# Patient Record
Sex: Male | Born: 1955 | Race: White | Hispanic: No | Marital: Single
Health system: Midwestern US, Academic
[De-identification: ages and names within clinical notes are randomized; demographics above are authoritative.]

---

## 2018-06-09 ENCOUNTER — Inpatient Hospital Stay: Admit: 2018-06-09 | Discharge: 2018-06-18 | Disposition: A | Discharge status: Skilled Nursing Facility | Payer: 59

## 2018-06-09 ENCOUNTER — Encounter: Admit: 2018-06-09 | Discharge: 2018-06-09 | Payer: 59

## 2018-06-09 DIAGNOSIS — R079 Chest pain, unspecified: ICD-10-CM

## 2018-06-09 DIAGNOSIS — E785 Hyperlipidemia, unspecified: Principal | ICD-10-CM

## 2018-06-09 DIAGNOSIS — R7989 Other specified abnormal findings of blood chemistry: ICD-10-CM

## 2018-06-09 LAB — POC TROPONIN

## 2018-06-09 LAB — CBC AND DIFF
Lab: 0.1 10*3/uL (ref 0–0.20)
Lab: 0.4 10*3/uL (ref 0–0.45)
Lab: 8.2 K/UL — ABNORMAL LOW (ref 4.5–11.0)

## 2018-06-09 MED ORDER — POTASSIUM CHLORIDE 20 MEQ PO TBTQ
40 meq | Freq: Once | ORAL | 0 refills | Status: CP
Start: 2018-06-09 — End: ?
  Administered 2018-06-10: 04:00:00 40 meq via ORAL

## 2018-06-09 MED ORDER — ENOXAPARIN 40 MG/0.4 ML SC SYRG
40 mg | Freq: Every day | SUBCUTANEOUS | 0 refills | Status: DC
Start: 2018-06-09 — End: 2018-06-19
  Administered 2018-06-10 – 2018-06-18 (×7): 40 mg via SUBCUTANEOUS

## 2018-06-09 MED ORDER — CLONAZEPAM 1 MG PO TAB
1 mg | Freq: Two times a day (BID) | ORAL | 0 refills | Status: DC
Start: 2018-06-09 — End: 2018-06-10
  Administered 2018-06-10: 14:00:00 1 mg via ORAL

## 2018-06-09 MED ORDER — CLOPIDOGREL 75 MG PO TAB
75 mg | Freq: Every day | ORAL | 0 refills | Status: DC
Start: 2018-06-09 — End: 2018-06-13
  Administered 2018-06-10 – 2018-06-13 (×3): 75 mg via ORAL

## 2018-06-09 MED ORDER — QUETIAPINE 200 MG PO TAB
200 mg | Freq: Every evening | ORAL | 0 refills | Status: DC
Start: 2018-06-09 — End: 2018-06-19
  Administered 2018-06-10 – 2018-06-18 (×9): 200 mg via ORAL

## 2018-06-09 MED ORDER — FLUOXETINE 10 MG PO CAP
20 mg | Freq: Every day | ORAL | 0 refills | Status: DC
Start: 2018-06-09 — End: 2018-06-19
  Administered 2018-06-10 – 2018-06-18 (×9): 20 mg via ORAL

## 2018-06-09 MED ORDER — ATORVASTATIN 40 MG PO TAB
40 mg | Freq: Every day | ORAL | 0 refills | Status: DC
Start: 2018-06-09 — End: 2018-06-19
  Administered 2018-06-10 – 2018-06-18 (×7): 40 mg via ORAL

## 2018-06-09 MED ORDER — BENZTROPINE 1 MG PO TAB
1 mg | Freq: Every evening | ORAL | 0 refills | Status: DC
Start: 2018-06-09 — End: 2018-06-19
  Administered 2018-06-10 – 2018-06-18 (×9): 1 mg via ORAL

## 2018-06-10 LAB — COMPREHENSIVE METABOLIC PANEL
Lab: 0.4 mg/dL (ref 0.3–1.2)
Lab: 1.1 mg/dL (ref >60)
Lab: 103 mg/dL — ABNORMAL HIGH (ref 70–100)
Lab: 13 K/UL — ABNORMAL HIGH (ref 3–12)
Lab: 139 MMOL/L — ABNORMAL HIGH (ref 137–147)
Lab: 163 U/L — ABNORMAL HIGH (ref 25–110)
Lab: 21 mg/dL — ABNORMAL HIGH (ref >60)
Lab: 24 MMOL/L (ref 21–30)
Lab: 26 U/L (ref 7–56)
Lab: 3.8 MMOL/L — ABNORMAL HIGH (ref 3.5–5.1)
Lab: 8.2 g/dL — ABNORMAL HIGH (ref 6.0–8.0)
Lab: 9.8 mg/dL — ABNORMAL LOW (ref 8.5–10.6)
Lab: >60 mL/min (ref >60)
Lab: >60 mL/min (ref >60)
Lab: 103 MMOL/L — ABNORMAL HIGH (ref 98–110)
Lab: 138 MMOL/L — ABNORMAL LOW (ref >60)
Lab: 4.6 MMOL/L (ref 3.5–5.1)

## 2018-06-10 LAB — BNP (B-TYPE NATRIURETIC PEPTI): Lab: 57 pg/mL — ABNORMAL LOW (ref 0–100)

## 2018-06-10 LAB — OPIATES-URINE RANDOM: Lab: NEGATIVE

## 2018-06-10 LAB — CBC
Lab: 4.9 M/UL — ABNORMAL HIGH (ref 4.4–5.5)
Lab: 8.5 K/UL — ABNORMAL LOW (ref 4.5–11.0)

## 2018-06-10 LAB — TROPONIN-I

## 2018-06-10 LAB — PHENCYCLIDINES-URINE RANDOM: Lab: NEGATIVE

## 2018-06-10 LAB — CANNABINOIDS-URINE RANDOM: Lab: NEGATIVE

## 2018-06-10 LAB — POC TROPONIN

## 2018-06-10 LAB — BARBITURATES-URINE RANDOM: Lab: NEGATIVE

## 2018-06-10 LAB — MAGNESIUM: Lab: 2.2 mg/dL — ABNORMAL HIGH (ref >60)

## 2018-06-10 LAB — COCAINE-URINE RANDOM: Lab: NEGATIVE

## 2018-06-10 LAB — AMPHETAMINES-URINE RANDOM: Lab: NEGATIVE

## 2018-06-10 LAB — HEMOGLOBIN A1C: Lab: 5.8 % (ref 4.0–6.0)

## 2018-06-10 LAB — BENZODIAZEPINES-URINE RANDOM: Lab: POSITIVE — AB

## 2018-06-10 MED ORDER — CLONAZEPAM 0.5 MG PO TAB
.5 mg | Freq: Two times a day (BID) | ORAL | 0 refills | Status: DC
Start: 2018-06-10 — End: 2018-06-13
  Administered 2018-06-11 – 2018-06-13 (×6): 0.5 mg via ORAL

## 2018-06-10 MED ORDER — ASPIRIN 81 MG PO CHEW
81 mg | Freq: Every day | ORAL | 0 refills | Status: DC
Start: 2018-06-10 — End: 2018-06-11
  Administered 2018-06-10 – 2018-06-11 (×2): 81 mg via ORAL

## 2018-06-10 MED ORDER — ONDANSETRON HCL (PF) 4 MG/2 ML IJ SOLN
4 mg | INTRAVENOUS | 0 refills | Status: DC | PRN
Start: 2018-06-10 — End: 2018-06-13
  Administered 2018-06-10 – 2018-06-11 (×3): 4 mg via INTRAVENOUS

## 2018-06-10 MED ORDER — ACETAMINOPHEN 325 MG PO TAB
650 mg | ORAL | 0 refills | Status: DC | PRN
Start: 2018-06-10 — End: 2018-06-13
  Administered 2018-06-11 – 2018-06-13 (×3): 650 mg via ORAL

## 2018-06-10 MED ORDER — QUETIAPINE 25 MG PO TAB
50 mg | Freq: Two times a day (BID) | ORAL | 0 refills | Status: DC
Start: 2018-06-10 — End: 2018-06-19
  Administered 2018-06-10 – 2018-06-18 (×16): 50 mg via ORAL

## 2018-06-11 ENCOUNTER — Observation Stay: Admit: 2018-06-11 | Discharge: 2018-06-11 | Payer: 59

## 2018-06-11 LAB — COMPREHENSIVE METABOLIC PANEL
Lab: 103 MMOL/L — ABNORMAL HIGH (ref >60)
Lab: 136 MMOL/L — ABNORMAL LOW (ref 137–147)

## 2018-06-11 LAB — MAGNESIUM: Lab: 2.1 mg/dL — ABNORMAL HIGH (ref 1.6–2.6)

## 2018-06-11 MED ORDER — PERFLUTREN LIPID MICROSPHERES 1.1 MG/ML IV SUSP
1-20 mL | Freq: Once | INTRAVENOUS | 0 refills | Status: CP | PRN
Start: 2018-06-11 — End: ?
  Administered 2018-06-11: 14:00:00 2 mL via INTRAVENOUS

## 2018-06-11 MED ORDER — AMINOPHYLLINE 500 MG/20 ML IV SOLN
50 mg | INTRAVENOUS | 0 refills | Status: AC | PRN
Start: 2018-06-11 — End: ?

## 2018-06-11 MED ORDER — SODIUM CHLORIDE 0.9 % IV SOLP
INTRAVENOUS | 0 refills | Status: DC
Start: 2018-06-11 — End: 2018-06-12
  Administered 2018-06-11 (×2): 1000.000 mL via INTRAVENOUS

## 2018-06-11 MED ORDER — BENZOCAINE-MENTHOL 6-10 MG MM LOZG
1 | Freq: Once | ORAL | 0 refills | Status: AC | PRN
Start: 2018-06-11 — End: ?

## 2018-06-11 MED ORDER — ALBUTEROL SULFATE 90 MCG/ACTUATION IN HFAA
2 | RESPIRATORY_TRACT | 0 refills | Status: DC | PRN
Start: 2018-06-11 — End: 2018-06-19
  Administered 2018-06-11: 12:00:00 2 via RESPIRATORY_TRACT

## 2018-06-11 MED ORDER — ASPIRIN 81 MG PO CHEW
81 mg | Freq: Every day | ORAL | 0 refills | Status: DC
Start: 2018-06-11 — End: 2018-06-13
  Administered 2018-06-13: 14:00:00 81 mg via ORAL

## 2018-06-11 MED ORDER — ALBUTEROL SULFATE 90 MCG/ACTUATION IN HFAA
2 | RESPIRATORY_TRACT | 0 refills | Status: DC | PRN
Start: 2018-06-11 — End: 2018-06-11

## 2018-06-11 MED ORDER — ASPIRIN 325 MG PO TAB
325 mg | Freq: Once | ORAL | 0 refills | Status: CP
Start: 2018-06-11 — End: ?
  Administered 2018-06-12: 11:00:00 325 mg via ORAL

## 2018-06-11 MED ORDER — SODIUM CHLORIDE 0.9 % IV SOLP
250 mL | INTRAVENOUS | 0 refills | Status: DC | PRN
Start: 2018-06-11 — End: 2018-06-11

## 2018-06-11 MED ORDER — NITROGLYCERIN 0.4 MG SL SUBL
.4 mg | SUBLINGUAL | 0 refills | Status: AC | PRN
Start: 2018-06-11 — End: ?

## 2018-06-11 MED ORDER — REGADENOSON 0.4 MG/5 ML IV SYRG
.4 mg | Freq: Once | INTRAVENOUS | 0 refills | Status: CP
Start: 2018-06-11 — End: ?
  Administered 2018-06-11: 14:00:00 0.4 mg via INTRAVENOUS

## 2018-06-12 LAB — COMPREHENSIVE METABOLIC PANEL: Lab: 138 MMOL/L — ABNORMAL HIGH (ref 137–147)

## 2018-06-12 LAB — MAGNESIUM: Lab: 2.1 mg/dL — ABNORMAL LOW (ref 1.6–2.6)

## 2018-06-12 LAB — CBC: Lab: 7.4 K/UL — ABNORMAL HIGH (ref 4.5–11.0)

## 2018-06-12 MED ORDER — POLYETHYLENE GLYCOL 3350 17 GRAM PO PWPK
1 | Freq: Every day | ORAL | 0 refills | Status: DC
Start: 2018-06-12 — End: 2018-06-16
  Administered 2018-06-12 – 2018-06-16 (×4): 17 g via ORAL

## 2018-06-13 ENCOUNTER — Encounter: Admit: 2018-06-13 | Discharge: 2018-06-13 | Payer: 59

## 2018-06-13 LAB — MAGNESIUM: Lab: 2.2 mg/dL — ABNORMAL HIGH (ref 1.6–2.6)

## 2018-06-13 LAB — CBC: Lab: 8.2 K/UL (ref >60)

## 2018-06-13 LAB — COMPREHENSIVE METABOLIC PANEL: Lab: 139 MMOL/L — ABNORMAL LOW (ref >60)

## 2018-06-13 MED ORDER — SODIUM CHLORIDE 0.9 % IV SOLP
INTRAVENOUS | 0 refills | Status: DC
Start: 2018-06-13 — End: 2018-06-13
  Administered 2018-06-13: 16:00:00 1000.000 mL via INTRAVENOUS

## 2018-06-13 MED ORDER — DIPHENHYDRAMINE HCL 50 MG/ML IJ SOLN
25 mg | INTRAVENOUS | 0 refills | Status: DC | PRN
Start: 2018-06-13 — End: 2018-06-15

## 2018-06-13 MED ORDER — SODIUM CHLORIDE 0.9 % IV SOLP
INTRAVENOUS | 0 refills | Status: AC
Start: 2018-06-13 — End: ?
  Administered 2018-06-13: 22:00:00 1000.000 mL via INTRAVENOUS

## 2018-06-13 MED ORDER — ASPIRIN 81 MG PO CHEW
81 mg | Freq: Every day | ORAL | 0 refills | Status: DC
Start: 2018-06-13 — End: 2018-06-19
  Administered 2018-06-14 – 2018-06-18 (×5): 81 mg via ORAL

## 2018-06-13 MED ORDER — DIPHENHYDRAMINE HCL 25 MG PO CAP
25 mg | ORAL | 0 refills | Status: DC | PRN
Start: 2018-06-13 — End: 2018-06-15

## 2018-06-13 MED ORDER — ONDANSETRON HCL (PF) 4 MG/2 ML IJ SOLN
4 mg | INTRAVENOUS | 0 refills | Status: DC | PRN
Start: 2018-06-13 — End: 2018-06-19
  Administered 2018-06-14 – 2018-06-16 (×3): 4 mg via INTRAVENOUS

## 2018-06-13 MED ORDER — CLOPIDOGREL 75 MG PO TAB
75 mg | Freq: Every day | ORAL | 0 refills | Status: DC
Start: 2018-06-13 — End: 2018-06-19
  Administered 2018-06-14 – 2018-06-18 (×5): 75 mg via ORAL

## 2018-06-13 MED ORDER — ASPIRIN 81 MG PO CHEW
243 mg | Freq: Once | ORAL | 0 refills | Status: CP
Start: 2018-06-13 — End: ?
  Administered 2018-06-13: 16:00:00 243 mg via ORAL

## 2018-06-13 MED ORDER — CLONAZEPAM 0.5 MG PO TAB
.5 mg | Freq: Three times a day (TID) | ORAL | 0 refills | Status: DC
Start: 2018-06-13 — End: 2018-06-19
  Administered 2018-06-14 – 2018-06-18 (×15): 0.5 mg via ORAL

## 2018-06-13 MED ORDER — CLOTRIMAZOLE 1 % TP CREA
Freq: Two times a day (BID) | TOPICAL | 0 refills | Status: DC
Start: 2018-06-13 — End: 2018-06-19
  Administered 2018-06-13 – 2018-06-16 (×4): via TOPICAL

## 2018-06-13 MED ORDER — ALUMINUM-MAGNESIUM HYDROXIDE 200-200 MG/5 ML PO SUSP
30 mL | ORAL | 0 refills | Status: DC | PRN
Start: 2018-06-13 — End: 2018-06-19

## 2018-06-13 MED ORDER — ACETAMINOPHEN 325 MG PO TAB
650 mg | ORAL | 0 refills | Status: DC | PRN
Start: 2018-06-13 — End: 2018-06-19
  Administered 2018-06-14 – 2018-06-17 (×5): 650 mg via ORAL

## 2018-06-14 LAB — COMPREHENSIVE METABOLIC PANEL
Lab: 137 MMOL/L — ABNORMAL LOW (ref 137–147)
Lab: 91 mg/dL — ABNORMAL HIGH (ref 70–100)

## 2018-06-14 LAB — MAGNESIUM: Lab: 2.4 mg/dL — ABNORMAL HIGH (ref >60)

## 2018-06-14 LAB — CBC: Lab: 7.4 K/UL — ABNORMAL LOW (ref >60)

## 2018-06-15 LAB — BASIC METABOLIC PANEL: Lab: 138 MMOL/L — ABNORMAL LOW (ref 137–147)

## 2018-06-15 LAB — URINALYSIS DIPSTICK REFLEX TO CULTURE
Lab: NEGATIVE % (ref 0–2)
Lab: NEGATIVE % (ref 0–5)
Lab: NEGATIVE 10*3/uL (ref 0–0.20)
Lab: NEGATIVE 10*3/uL — ABNORMAL HIGH (ref 0–0.80)
Lab: NEGATIVE 10*3/uL — ABNORMAL HIGH (ref 1.8–7.0)
Lab: NEGATIVE 10*3/uL — ABNORMAL LOW (ref 1.0–4.8)
Lab: NEGATIVE
Lab: NEGATIVE

## 2018-06-15 LAB — URINALYSIS MICROSCOPIC REFLEX TO CULTURE

## 2018-06-15 LAB — CBC
Lab: 4.6 M/UL — ABNORMAL LOW (ref 4.4–5.5)
Lab: 8 K/UL — ABNORMAL HIGH (ref 4.5–11.0)

## 2018-06-15 MED ORDER — FLUTICASONE PROPIONATE 50 MCG/ACTUATION NA SPSN
2 | Freq: Every day | NASAL | 0 refills | Status: DC
Start: 2018-06-15 — End: 2018-06-19
  Administered 2018-06-15: 18:00:00 2 via NASAL

## 2018-06-16 DIAGNOSIS — R079 Chest pain, unspecified: ICD-10-CM

## 2018-06-16 LAB — POC ACTIVATED CLOTTING TIME: Lab: 279 s

## 2018-06-16 LAB — BASIC METABOLIC PANEL: Lab: 139 MMOL/L — ABNORMAL LOW (ref 137–147)

## 2018-06-16 LAB — CBC: Lab: 9.1 K/UL — ABNORMAL HIGH (ref >60)

## 2018-06-16 MED ORDER — POLYETHYLENE GLYCOL 3350 17 GRAM PO PWPK
1 | Freq: Two times a day (BID) | ORAL | 0 refills | Status: DC
Start: 2018-06-16 — End: 2018-06-19
  Administered 2018-06-17 – 2018-06-18 (×4): 17 g via ORAL

## 2018-06-17 LAB — CBC: Lab: 8.7 K/UL — ABNORMAL HIGH (ref >60)

## 2018-06-17 LAB — BASIC METABOLIC PANEL: Lab: 140 MMOL/L — ABNORMAL HIGH (ref >60)

## 2018-06-17 MED ORDER — BISACODYL 10 MG RE SUPP
10 mg | Freq: Once | RECTAL | 0 refills | Status: AC
Start: 2018-06-17 — End: ?

## 2018-06-17 MED ORDER — MAGNESIUM CITRATE PO SOLN
296 mL | Freq: Once | ORAL | 0 refills | Status: CP
Start: 2018-06-17 — End: ?
  Administered 2018-06-17: 296 mL via ORAL

## 2018-06-18 ENCOUNTER — Observation Stay: Admit: 2018-06-11 | Discharge: 2018-06-11 | Payer: 59

## 2018-06-18 ENCOUNTER — Inpatient Hospital Stay: Admit: 2018-06-16 | Discharge: 2018-06-16 | Payer: 59

## 2018-06-18 ENCOUNTER — Emergency Department: Admit: 2018-06-09 | Discharge: 2018-06-09 | Payer: 59

## 2018-06-18 DIAGNOSIS — Z9119 Patient's noncompliance with other medical treatment and regimen: ICD-10-CM

## 2018-06-18 DIAGNOSIS — R21 Rash and other nonspecific skin eruption: ICD-10-CM

## 2018-06-18 DIAGNOSIS — E785 Hyperlipidemia, unspecified: ICD-10-CM

## 2018-06-18 DIAGNOSIS — F4322 Adjustment disorder with anxiety: ICD-10-CM

## 2018-06-18 DIAGNOSIS — I25110 Atherosclerotic heart disease of native coronary artery with unstable angina pectoris: Principal | ICD-10-CM

## 2018-06-18 DIAGNOSIS — F1490 Cocaine use, unspecified, uncomplicated: ICD-10-CM

## 2018-06-18 DIAGNOSIS — I249 Acute ischemic heart disease, unspecified: ICD-10-CM

## 2018-06-18 DIAGNOSIS — F411 Generalized anxiety disorder: ICD-10-CM

## 2018-06-18 DIAGNOSIS — F1590 Other stimulant use, unspecified, uncomplicated: ICD-10-CM

## 2018-06-18 DIAGNOSIS — F209 Schizophrenia, unspecified: ICD-10-CM

## 2018-06-18 DIAGNOSIS — F1010 Alcohol abuse, uncomplicated: ICD-10-CM

## 2018-06-18 DIAGNOSIS — K5900 Constipation, unspecified: ICD-10-CM

## 2018-06-18 DIAGNOSIS — N179 Acute kidney failure, unspecified: ICD-10-CM

## 2018-06-18 DIAGNOSIS — Z87891 Personal history of nicotine dependence: ICD-10-CM

## 2018-06-18 LAB — BASIC METABOLIC PANEL
Lab: 140 MMOL/L — ABNORMAL HIGH (ref 137–147)
Lab: 4.2 MMOL/L — ABNORMAL LOW (ref 3.5–5.1)

## 2018-06-18 LAB — CBC: Lab: 9.5 K/UL — ABNORMAL HIGH (ref 4.5–11.0)

## 2018-06-18 MED ORDER — MAGNESIUM HYDROXIDE 400 MG/5 ML PO SUSP
30 mL | ORAL | 0 refills | Status: SS | PRN
Start: 2018-06-18 — End: 2018-10-02

## 2018-06-18 MED ORDER — CLONAZEPAM 0.5 MG PO TAB
.5 mg | ORAL_TABLET | Freq: Three times a day (TID) | ORAL | 0 refills | Status: SS
Start: 2018-06-18 — End: 2018-10-02

## 2018-06-18 MED ORDER — ATORVASTATIN 40 MG PO TAB
40 mg | ORAL_TABLET | Freq: Every day | ORAL | 3 refills | Status: SS
Start: 2018-06-18 — End: 2018-10-02

## 2018-06-18 MED ORDER — POLYETHYLENE GLYCOL 3350 17 GRAM PO PWPK
17 g | Freq: Two times a day (BID) | ORAL | 0 refills | Status: SS
Start: 2018-06-18 — End: 2018-10-02

## 2018-06-20 ENCOUNTER — Ambulatory Visit: Admit: 2018-06-20 | Discharge: 2018-06-20 | Payer: 59

## 2018-06-24 ENCOUNTER — Ambulatory Visit: Admit: 2018-06-24 | Discharge: 2018-06-24 | Payer: 59

## 2018-06-25 ENCOUNTER — Encounter: Admit: 2018-06-25 | Discharge: 2018-06-25 | Payer: 59

## 2018-06-26 ENCOUNTER — Ambulatory Visit: Admit: 2018-06-26 | Discharge: 2018-06-26 | Payer: 59

## 2018-07-01 ENCOUNTER — Ambulatory Visit: Admit: 2018-07-01 | Discharge: 2018-07-01 | Payer: 59

## 2018-07-09 ENCOUNTER — Ambulatory Visit: Admit: 2018-07-09 | Discharge: 2018-07-09 | Payer: 59

## 2018-07-10 ENCOUNTER — Ambulatory Visit: Admit: 2018-07-10 | Discharge: 2018-07-10 | Payer: 59

## 2018-07-16 ENCOUNTER — Encounter: Admit: 2018-07-16 | Discharge: 2018-07-16 | Payer: 59

## 2018-07-22 ENCOUNTER — Encounter: Admit: 2018-07-22 | Discharge: 2018-07-22 | Payer: 59

## 2018-07-29 ENCOUNTER — Encounter: Admit: 2018-07-29 | Discharge: 2018-07-29 | Payer: 59

## 2018-09-30 ENCOUNTER — Encounter: Admit: 2018-09-30 | Discharge: 2018-09-30 | Payer: 59

## 2018-09-30 ENCOUNTER — Inpatient Hospital Stay: Admit: 2018-10-01 | Discharge: 2018-10-15 | Disposition: A | Discharge status: Skilled Nursing Facility | Payer: 59

## 2018-09-30 ENCOUNTER — Emergency Department: Admit: 2018-09-30 | Discharge: 2018-09-30 | Payer: 59

## 2018-09-30 DIAGNOSIS — E785 Hyperlipidemia, unspecified: Principal | ICD-10-CM

## 2018-09-30 DIAGNOSIS — R079 Chest pain, unspecified: Secondary | ICD-10-CM

## 2018-09-30 DIAGNOSIS — Z0279 Encounter for issue of other medical certificate: ICD-10-CM

## 2018-09-30 DIAGNOSIS — F331 Major depressive disorder, recurrent, moderate: Secondary | ICD-10-CM

## 2018-09-30 MED ORDER — IOHEXOL 350 MG IODINE/ML IV SOLN
75 mL | Freq: Once | INTRAVENOUS | 0 refills | Status: CP
Start: 2018-09-30 — End: ?
  Administered 2018-10-01: 06:00:00 75 mL via INTRAVENOUS

## 2018-09-30 MED ORDER — SODIUM CHLORIDE 0.9 % IJ SOLN
50 mL | Freq: Once | INTRAVENOUS | 0 refills | Status: CP
Start: 2018-09-30 — End: ?
  Administered 2018-10-01: 06:00:00 50 mL via INTRAVENOUS

## 2018-10-01 ENCOUNTER — Encounter: Admit: 2018-10-01 | Discharge: 2018-10-01 | Payer: 59

## 2018-10-01 ENCOUNTER — Emergency Department: Admit: 2018-10-01 | Discharge: 2018-10-01 | Payer: 59

## 2018-10-01 DIAGNOSIS — R079 Chest pain, unspecified: Secondary | ICD-10-CM

## 2018-10-01 DIAGNOSIS — I2510 Atherosclerotic heart disease of native coronary artery without angina pectoris: ICD-10-CM

## 2018-10-01 DIAGNOSIS — E785 Hyperlipidemia, unspecified: Principal | ICD-10-CM

## 2018-10-01 LAB — D-DIMER: Lab: 875 ng{FEU}/mL — ABNORMAL HIGH (ref <500)

## 2018-10-01 LAB — URINALYSIS DIPSTICK
Lab: 1 mg/dL (ref 1.003–1.035)
Lab: NEGATIVE 10*3/uL — ABNORMAL HIGH (ref 0–0.45)
Lab: NEGATIVE U/L (ref 7–56)
Lab: NEGATIVE mL/min (ref 0–0.80)
Lab: NEGATIVE mL/min (ref 1.0–4.8)
Lab: NEGATIVE
Lab: POSITIVE — AB

## 2018-10-01 LAB — COCAINE-URINE RANDOM: Lab: NEGATIVE

## 2018-10-01 LAB — TROPONIN-I: Lab: 0 ng/mL (ref 0.0–0.05)

## 2018-10-01 LAB — BENZODIAZEPINES-URINE RANDOM: Lab: POSITIVE — AB

## 2018-10-01 LAB — LIPID PROFILE
Lab: 131 mg/dL — ABNORMAL HIGH (ref <100)
Lab: 135 mg/dL
Lab: 16 mg/dL
Lab: 188 mg/dL (ref <200)
Lab: 53 mg/dL (ref >40)
Lab: 82 mg/dL (ref <150)

## 2018-10-01 LAB — CBC AND DIFF: Lab: 9.2 10*3/uL (ref 4.5–11.0)

## 2018-10-01 LAB — BARBITURATES-URINE RANDOM: Lab: NEGATIVE

## 2018-10-01 LAB — AMMONIA: Lab: 34 umol/L (ref 9–35)

## 2018-10-01 LAB — HEMOGLOBIN A1C: Lab: 5.7 % (ref 4.0–6.0)

## 2018-10-01 LAB — PHENCYCLIDINES-URINE RANDOM: Lab: NEGATIVE

## 2018-10-01 LAB — AMPHETAMINES-URINE RANDOM: Lab: NEGATIVE

## 2018-10-01 LAB — URINALYSIS, MICROSCOPIC

## 2018-10-01 LAB — CANNABINOIDS-URINE RANDOM: Lab: NEGATIVE

## 2018-10-01 LAB — COMPREHENSIVE METABOLIC PANEL
Lab: 101 MMOL/L — ABNORMAL LOW (ref 98–110)
Lab: 11 10*3/uL (ref 3–12)
Lab: 140 MMOL/L — ABNORMAL LOW (ref 137–147)
Lab: 96 mg/dL (ref 70–100)

## 2018-10-01 LAB — OPIATES-URINE RANDOM: Lab: NEGATIVE

## 2018-10-01 LAB — MAGNESIUM
Lab: 2.2 mg/dL — ABNORMAL HIGH (ref 1.6–2.6)
Lab: 2.2 mg/dL (ref 1.6–2.6)

## 2018-10-01 LAB — LIPASE: Lab: 21 U/L — ABNORMAL LOW (ref 11–82)

## 2018-10-01 LAB — PHOSPHORUS: Lab: 3.9 mg/dL (ref 2.0–4.5)

## 2018-10-01 MED ORDER — LORAZEPAM 1 MG PO TAB
1 mg | ORAL | 0 refills | Status: CN
Start: 2018-10-01 — End: ?

## 2018-10-01 MED ORDER — ALBUTEROL SULFATE 90 MCG/ACTUATION IN HFAA
2 | RESPIRATORY_TRACT | 0 refills | Status: DC | PRN
Start: 2018-10-01 — End: 2018-10-15
  Administered 2018-10-01: 14:00:00 2 via RESPIRATORY_TRACT

## 2018-10-01 MED ORDER — LORAZEPAM 1 MG PO TAB
1 mg | Freq: Two times a day (BID) | ORAL | 0 refills | Status: CN
Start: 2018-10-01 — End: ?

## 2018-10-01 MED ORDER — QUETIAPINE 25 MG PO TAB
50 mg | Freq: Two times a day (BID) | ORAL | 0 refills | Status: DC
Start: 2018-10-01 — End: 2018-10-02
  Administered 2018-10-02 (×2): 50 mg via ORAL

## 2018-10-01 MED ORDER — ATORVASTATIN 40 MG PO TAB
40 mg | Freq: Every day | ORAL | 0 refills | Status: DC
Start: 2018-10-01 — End: 2018-10-01

## 2018-10-01 MED ORDER — ALBUTEROL SULFATE 2.5 MG/0.5 ML IN NEBU
2.5 mg | RESPIRATORY_TRACT | 0 refills | Status: DC | PRN
Start: 2018-10-01 — End: 2018-10-02

## 2018-10-01 MED ORDER — ENOXAPARIN 40 MG/0.4 ML SC SYRG
40 mg | Freq: Every day | SUBCUTANEOUS | 0 refills | Status: DC
Start: 2018-10-01 — End: 2018-10-15
  Administered 2018-10-02 – 2018-10-15 (×12): 40 mg via SUBCUTANEOUS

## 2018-10-01 MED ORDER — IPRATROPIUM BROMIDE 0.02 % IN SOLN
.5 mg | RESPIRATORY_TRACT | 0 refills | Status: DC | PRN
Start: 2018-10-01 — End: 2018-10-02

## 2018-10-01 MED ORDER — LORAZEPAM 1 MG PO TAB
2 mg | ORAL | 0 refills | Status: CN
Start: 2018-10-01 — End: ?

## 2018-10-01 MED ORDER — PERFLUTREN LIPID MICROSPHERES 1.1 MG/ML IV SUSP
1-20 mL | Freq: Once | INTRAVENOUS | 0 refills | Status: CP | PRN
Start: 2018-10-01 — End: ?
  Administered 2018-10-01: 20:00:00 2 mL via INTRAVENOUS

## 2018-10-01 MED ORDER — CLOPIDOGREL 75 MG PO TAB
75 mg | Freq: Every day | ORAL | 0 refills | Status: DC
Start: 2018-10-01 — End: 2018-10-01

## 2018-10-01 MED ORDER — POLYETHYLENE GLYCOL 3350 17 GRAM PO PWPK
1 | Freq: Every day | ORAL | 0 refills | Status: DC | PRN
Start: 2018-10-01 — End: 2018-10-15
  Administered 2018-10-02: 16:00:00 17 g via ORAL

## 2018-10-01 MED ORDER — THIAMINE MONONITRATE (VIT B1) 100 MG PO TAB
100 mg | Freq: Every day | ORAL | 0 refills | Status: DC
Start: 2018-10-01 — End: 2018-10-15
  Administered 2018-10-01 – 2018-10-15 (×15): 100 mg via ORAL

## 2018-10-01 MED ORDER — DIPHENHYDRAMINE(#)/LIDOCAINE/ANTACID 1:1:1 PO SUSP
10 mL | Freq: Three times a day (TID) | ORAL | 0 refills | Status: DC
Start: 2018-10-01 — End: 2018-10-15
  Administered 2018-10-01 – 2018-10-13 (×8): 10 mL via ORAL

## 2018-10-01 MED ORDER — FLUOXETINE 10 MG PO CAP
40 mg | Freq: Every day | ORAL | 0 refills | Status: DC
Start: 2018-10-01 — End: 2018-10-15
  Administered 2018-10-01 – 2018-10-15 (×15): 40 mg via ORAL

## 2018-10-01 MED ORDER — CLOPIDOGREL 75 MG PO TAB
75 mg | Freq: Every day | ORAL | 0 refills | Status: DC
Start: 2018-10-01 — End: 2018-10-15
  Administered 2018-10-01 – 2018-10-15 (×14): 75 mg via ORAL

## 2018-10-01 MED ORDER — MULTIVITAMIN PO TAB
1 | Freq: Every day | ORAL | 0 refills | Status: DC
Start: 2018-10-01 — End: 2018-10-15
  Administered 2018-10-01 – 2018-10-15 (×15): 1 via ORAL

## 2018-10-01 MED ORDER — IPRATROPIUM BROMIDE 0.02 % IN SOLN
.5 mg | RESPIRATORY_TRACT | 0 refills | Status: DC | PRN
Start: 2018-10-01 — End: 2018-10-03

## 2018-10-01 MED ORDER — ONDANSETRON HCL (PF) 4 MG/2 ML IJ SOLN
4 mg | INTRAVENOUS | 0 refills | Status: DC | PRN
Start: 2018-10-01 — End: 2018-10-15

## 2018-10-01 MED ORDER — HALOPERIDOL LACTATE 5 MG/ML IJ SOLN
2 mg | INTRAMUSCULAR | 0 refills | Status: DC | PRN
Start: 2018-10-01 — End: 2018-10-13
  Administered 2018-10-03 – 2018-10-13 (×3): 2 mg via INTRAMUSCULAR

## 2018-10-01 MED ORDER — BENZTROPINE 1 MG PO TAB
1 mg | Freq: Every evening | ORAL | 0 refills | Status: DC
Start: 2018-10-01 — End: 2018-10-08
  Administered 2018-10-02 – 2018-10-08 (×7): 1 mg via ORAL

## 2018-10-01 MED ORDER — ONDANSETRON HCL 4 MG PO TAB
4 mg | ORAL | 0 refills | Status: DC | PRN
Start: 2018-10-01 — End: 2018-10-15

## 2018-10-01 MED ORDER — LORAZEPAM 1 MG PO TAB
1-2 mg | ORAL | 0 refills | Status: CN | PRN
Start: 2018-10-01 — End: ?

## 2018-10-01 MED ORDER — ALBUTEROL SULFATE 2.5 MG/0.5 ML IN NEBU
2.5 mg | RESPIRATORY_TRACT | 0 refills | Status: DC | PRN
Start: 2018-10-01 — End: 2018-10-03

## 2018-10-01 MED ORDER — PREDNISONE 20 MG PO TAB
40 mg | Freq: Every day | ORAL | 0 refills | Status: AC
Start: 2018-10-01 — End: ?
  Administered 2018-10-02 – 2018-10-05 (×4): 40 mg via ORAL

## 2018-10-01 MED ORDER — ASPIRIN 81 MG PO CHEW
81 mg | Freq: Every day | ORAL | 0 refills | Status: DC
Start: 2018-10-01 — End: 2018-10-15
  Administered 2018-10-01 – 2018-10-15 (×14): 81 mg via ORAL

## 2018-10-01 MED ORDER — HYDROXYZINE HCL 50 MG PO TAB
25-50 mg | ORAL | 0 refills | Status: DC | PRN
Start: 2018-10-01 — End: 2018-10-06
  Administered 2018-10-01: 16:00:00 25 mg via ORAL
  Administered 2018-10-02 – 2018-10-06 (×7): 50 mg via ORAL

## 2018-10-01 MED ORDER — VITAMIN B COMPLEX PO TAB
1 | Freq: Every day | ORAL | 0 refills | Status: DC
Start: 2018-10-01 — End: 2018-10-10
  Administered 2018-10-01 – 2018-10-10 (×10): 1 via ORAL

## 2018-10-01 MED ORDER — ASPIRIN 81 MG PO CHEW
81 mg | Freq: Every day | ORAL | 0 refills | Status: DC
Start: 2018-10-01 — End: 2018-10-01

## 2018-10-01 MED ORDER — MELATONIN 3 MG PO TAB
3 mg | Freq: Every evening | ORAL | 0 refills | Status: DC | PRN
Start: 2018-10-01 — End: 2018-10-15
  Administered 2018-10-02 – 2018-10-15 (×6): 3 mg via ORAL

## 2018-10-01 MED ORDER — FOLIC ACID 1 MG PO TAB
1 mg | Freq: Every day | ORAL | 0 refills | Status: DC
Start: 2018-10-01 — End: 2018-10-15
  Administered 2018-10-01 – 2018-10-14 (×13): 1 mg via ORAL

## 2018-10-01 MED ORDER — ATORVASTATIN 40 MG PO TAB
40 mg | Freq: Every day | ORAL | 0 refills | Status: DC
Start: 2018-10-01 — End: 2018-10-15
  Administered 2018-10-01 – 2018-10-15 (×14): 40 mg via ORAL

## 2018-10-01 MED ORDER — ACETAMINOPHEN 325 MG PO TAB
650 mg | ORAL | 0 refills | Status: DC | PRN
Start: 2018-10-01 — End: 2018-10-15
  Administered 2018-10-01 – 2018-10-14 (×11): 650 mg via ORAL

## 2018-10-01 MED ORDER — QUETIAPINE 100 MG PO TAB
200 mg | Freq: Every evening | ORAL | 0 refills | Status: DC
Start: 2018-10-01 — End: 2018-10-15
  Administered 2018-10-02 – 2018-10-15 (×13): 200 mg via ORAL

## 2018-10-01 MED ORDER — CLOPIDOGREL 75 MG PO TAB
225 mg | Freq: Once | ORAL | 0 refills | Status: CP
Start: 2018-10-01 — End: ?
  Administered 2018-10-01: 17:00:00 225 mg via ORAL

## 2018-10-01 MED ORDER — HALOPERIDOL 1 MG PO TAB
2 mg | ORAL | 0 refills | Status: DC | PRN
Start: 2018-10-01 — End: 2018-10-15
  Administered 2018-10-01 – 2018-10-13 (×9): 2 mg via ORAL

## 2018-10-01 NOTE — Consults
thinking is cloudy.  Having a hard time thinking clearly.  Difficulty answering questions.  Feels like his thinking has slowed down significantly.  Hard time finding the right words.  Feels like people are talking to him when they aren't.  It is unclear if this is a misperception or hallucinations.  He is significantly bothered by the distortion of reality.  This has been going on for the last 2 weeks.  Had a similar episode 3-4 years ago, but he was also hallucinating during that time.      Reports binging on alcohol.  Most recently drank 3 gallons over 3-5 days.  His last drink was reported to be 2-3 weeks ago, but he is a poor historian about the details.    Doesn't feel that his current medications are helping with his depression or anxiety.  Reports that he continues to struggle with low mood, a bland feeling about everything, sleep is variable.  Denies thoughts, intention, or plan to harm himself or others.  Symptoms of anxiety including feeling that he can't hardly lay or sit still for any period of time.  Feels that he constantly needs to be moving.  Also feels that his anxiety is worsened by his restless leg syndrome.  Has been out of the clonazepam for the last couple of weeks per his report.  Didn't have the money to afford his medications.  He reports that he was getting prescription medications but also buying it off the streets.  Reports he was taking 1 mg of Clonazepam prior to running out.  He purchased them off the streets to fill in when he would run out too quickly, but denies ever taking more than 1 mg at night.  Unclear how accurate the information is.  He did admit to taking Valium yesterday from a recent hospitalization.    Access to firearms?  denies    Past Psychiatric History:  Onset: years ago  Outpatient Provider: Neoma Laming through [facility] for the last couple of years  Diagnoses: anxiety, depression, alcohol use.  Denies diagnoses of Bipolar or Schizophrenia SpO2 Pulse: 93 (02/05 0829) BP: (125-169)/(68-126)   Temp:  [36.7 ???C (98.1 ???F)]   Pulse:  [76-98]   Respirations:  [12 PER MINUTE-22 PER MINUTE]   SpO2:  [89 %-100 %]      Mental Status Evaluation:    ??? General/Constitutional: 63 y/o white male who appears his approximate stated age.  Disheveled appearance.  Laying in hospital bed  ??? Eye Contact: fair  ??? Behavior: calm and cooperative.  Pleasant to talk to  ??? Speech: normal rate, rhythm, volume, and tone  ??? Mood: not good  ??? Affect: Dysphoric  ??? Thought Process: mostly linear.  Did have some trouble staying on topic but was redirectable  ??? Thought Content: Denies SI/HI  ??? Perception: Reports AH/VH  ??? Associations: intact  ??? Insight/Judgment: fair/partial    ??? Orientation: grossly intact  ??? Recent and remote memory: adequate for conversation, but did have some recall issues  ??? Attention span and concentration: adequate  ??? Cognition: alert  ??? Language: fluent, English speaker  ??? Fund of knowledge and vocabulary: below average    Focused Physical Exam:  ??? Neuro: no gross deficits noted on conversation  ??? Musculoskeletal: moved upper extremities without issue.  Didn't move lower extremities as much due to laying in hospital bed.    Lab/Radiology/Other Diagnostic Tests:  Pertinent labs reviewed    Cristine Polio, DO

## 2018-10-01 NOTE — ED Notes
Breakfast order placed for patient. Pt AAOx3. Unable to remember birth year but confirms it after having it said to him. Pt in NAD. Bilateral chest rise and fall. Bed in lowest, locked position and call light in reach. Pt updated on plan of care and denies having any needs at this time besides wanting to speak with the doctors when they round regarding his anxiety medications.

## 2018-10-01 NOTE — H&P (View-Only)
Non-medical: Not on file   Tobacco Use   ??? Smoking status: Former Smoker     Last attempt to quit: 06/09/2014     Years since quitting: 4.3   ??? Smokeless tobacco: Former Estate agent and Sexual Activity   ??? Alcohol use: Yes     Comment: states he drinks often but is unsure how often   ??? Drug use: Not Currently   ??? Sexual activity: Not on file   Lifestyle   ??? Physical activity:     Days per week: Not on file     Minutes per session: Not on file   ??? Stress: Not on file   Relationships   ??? Social connections:     Talks on phone: Not on file     Gets together: Not on file     Attends religious service: Not on file     Active member of club or organization: Not on file     Attends meetings of clubs or organizations: Not on file     Relationship status: Not on file   ??? Intimate partner violence:     Fear of current or ex partner: Not on file     Emotionally abused: Not on file     Physically abused: Not on file     Forced sexual activity: Not on file   Other Topics Concern   ??? Not on file   Social History Narrative   ??? Not on file      Immunizations (includes history and patient reported):   There is no immunization history on file for this patient.        Allergies:  Patient has no known allergies.    Medications:  Current Facility-Administered Medications   Medication   ??? aspirin chewable tablet 81 mg   ??? atorvastatin (LIPITOR) tablet 40 mg   ??? clopiDOGrel (PLAVIX) tablet 75 mg     Current Outpatient Medications   Medication Sig   ??? albuterol (PROAIR HFA) 90 mcg/actuation inhaler Inhale 2 puffs by mouth into the lungs every 6 hours as needed for Wheezing or Shortness of Breath. Shake well before use.   ??? aspirin 81 mg chewable tablet Chew 81 mg by mouth daily. Take with food.   ??? atorvastatin (LIPITOR) 40 mg tablet Take one tablet by mouth daily.   ??? benztropine (COGENTIN) 1 mg tablet Take 1 mg by mouth at bedtime daily.   ??? clonazePAM (KLONOPIN) 0.5 mg tablet Take one tablet by mouth three times daily. Skin:   Skin color, texture, turgor normal.  No rashes or lesions  Lymph nodes:  Cervical, supraclavicular nodes normal  Neurologic:  CNII - XII intact, unable to report the name of the hospital, but knows he is in a hospital, oriented to month and year.     Lab/Radiology/Other Diagnostic Tests:  24-hour labs:    Results for orders placed or performed during the hospital encounter of 09/30/18 (from the past 24 hour(s))   CBC AND DIFF    Collection Time: 09/30/18  9:32 PM   Result Value Ref Range    White Blood Cells 9.2 4.5 - 11.0 K/UL    RBC 4.23 (L) 4.4 - 5.5 M/UL    Hemoglobin 12.3 (L) 13.5 - 16.5 GM/DL    Hematocrit 16.1 (L) 40 - 50 %    MCV 86.4 80 - 100 FL    MCH 29.2 26 - 34 PG    MCHC 33.8 32.0 - 36.0 G/DL  RDW 13.4 11 - 15 %    Platelet Count 423 (H) 150 - 400 K/UL    MPV 7.0 7 - 11 FL    Neutrophils 68 41 - 77 %    Lymphocytes 18 (L) 24 - 44 %    Monocytes 8 4 - 12 %    Eosinophils 5 0 - 5 %    Basophils 1 0 - 2 %    Absolute Neutrophil Count 6.20 1.8 - 7.0 K/UL    Absolute Lymph Count 1.70 1.0 - 4.8 K/UL    Absolute Monocyte Count 0.70 0 - 0.80 K/UL    Absolute Eosinophil Count 0.50 (H) 0 - 0.45 K/UL    Absolute Basophil Count 0.10 0 - 0.20 K/UL   COMPREHENSIVE METABOLIC PANEL    Collection Time: 09/30/18  9:32 PM   Result Value Ref Range    Sodium 140 137 - 147 MMOL/L    Potassium 4.2 3.5 - 5.1 MMOL/L    Chloride 101 98 - 110 MMOL/L    Glucose 96 70 - 100 MG/DL    Blood Urea Nitrogen 9 7 - 25 MG/DL    Creatinine 1.61 0.4 - 1.24 MG/DL    Calcium 9.2 8.5 - 09.6 MG/DL    Total Protein 7.4 6.0 - 8.0 G/DL    Total Bilirubin 0.2 (L) 0.3 - 1.2 MG/DL    Albumin 4.0 3.5 - 5.0 G/DL    Alk Phosphatase 045 25 - 110 U/L    AST (SGOT) 25 7 - 40 U/L    CO2 28 21 - 30 MMOL/L    ALT (SGPT) 31 7 - 56 U/L    Anion Gap 11 3 - 12    eGFR Non African American >60 >60 mL/min    eGFR African American >60 >60 mL/min   LIPASE    Collection Time: 09/30/18  9:32 PM   Result Value Ref Range    Lipase 21 11 - 82 U/L   MAGNESIUM Collection Time: 09/30/18  9:32 PM   Result Value Ref Range    Magnesium 2.2 1.6 - 2.6 mg/dL   ALCOHOL LEVEL    Collection Time: 09/30/18  9:32 PM   Result Value Ref Range    Alcohol <10 MG/DL   D-DIMER    Collection Time: 09/30/18  9:32 PM   Result Value Ref Range    D-Dimer 875 (H) <500 ng/mL FEU   POC TROPONIN    Collection Time: 09/30/18  9:35 PM   Result Value Ref Range    Troponin-I-POC 0.00 0.00 - 0.05 NG/ML   AMMONIA    Collection Time: 09/30/18 10:18 PM   Result Value Ref Range    Ammonia 34 9 - 35 MCMOL/L   URINALYSIS DIPSTICK    Collection Time: 09/30/18 11:28 PM   Result Value Ref Range    Color,UA YELLOW     Turbidity,UA CLEAR CLEAR-CLEAR    Specific Gravity-Urine 1.018 1.003 - 1.035    pH,UA 6.0 5.0 - 8.0    Protein,UA NEG NEG-NEG    Glucose,UA NEG NEG-NEG    Ketones,UA NEG NEG-NEG    Bilirubin,UA NEG NEG-NEG    Blood,UA NEG NEG-NEG    Urobilinogen,UA NORMAL NORM-NORMAL    Nitrite,UA NEG NEG-NEG    Leukocytes,UA NEG NEG-NEG    Urine Ascorbic Acid, UA POS (A) NEG-NEG   URINALYSIS, MICROSCOPIC    Collection Time: 09/30/18 11:28 PM   Result Value Ref Range    WBCs,UA 0-2 0 - 2 /HPF  RBCs,UA 0-2 0 - 3 /HPF    MucousUA TRACE      Glucose: 96 (09/30/18 2132)  Pertinent radiology reviewed.    Cta Chest Wo/w Contrast+post Impression    Result Date: 10/01/2018  CHEST: 1.  No evidence of pulmonary embolism or acute abnormality in the chest. 2.  Sub-5 mm noncalcified nodule in the right lower lobe. This is likely postinfectious or postinflammatory. If the patient has risk factors for pulmonary malignancy, follow-up CT chest in one year. Otherwise no follow-up is required. 3.  Probable stent in the left anterior descending artery. ABDOMEN AND PELVIS: No acute abnormality in the abdomen or pelvis.  Finalized by Delmar Landau, M.D. on 10/01/2018 1:34 AM. Dictated by Delmar Landau, M.D. on 10/01/2018 12:11 AM.     Ct Head Wo Contrast    Result Date: 10/01/2018

## 2018-10-02 DIAGNOSIS — R079 Chest pain, unspecified: Secondary | ICD-10-CM

## 2018-10-02 LAB — CBC AND DIFF
Lab: 33 g/dL — ABNORMAL LOW (ref 32.0–36.0)
Lab: 385 K/UL — ABNORMAL LOW (ref >60)
Lab: 6.7 FL — ABNORMAL LOW (ref >60)

## 2018-10-02 MED ORDER — GABAPENTIN 300 MG PO CAP
300 mg | Freq: Three times a day (TID) | ORAL | 0 refills | Status: DC
Start: 2018-10-02 — End: 2018-10-03
  Administered 2018-10-02 – 2018-10-03 (×4): 300 mg via ORAL

## 2018-10-02 MED ORDER — SENNOSIDES-DOCUSATE SODIUM 8.6-50 MG PO TAB
1 | Freq: Two times a day (BID) | ORAL | 0 refills | Status: DC
Start: 2018-10-02 — End: 2018-10-15
  Administered 2018-10-02 – 2018-10-15 (×21): 1 via ORAL

## 2018-10-02 MED ORDER — LORAZEPAM 1 MG PO TAB
1 mg | Freq: Two times a day (BID) | ORAL | 0 refills | Status: CP
Start: 2018-10-02 — End: ?
  Administered 2018-10-05 – 2018-10-06 (×2): 1 mg via ORAL

## 2018-10-02 MED ORDER — POLYETHYLENE GLYCOL 3350 17 GRAM PO PWPK
1 | Freq: Every day | ORAL | 0 refills | Status: DC
Start: 2018-10-02 — End: 2018-10-15
  Administered 2018-10-07 – 2018-10-13 (×6): 17 g via ORAL

## 2018-10-02 MED ORDER — LORAZEPAM 1 MG PO TAB
1 mg | ORAL | 0 refills | Status: CP
Start: 2018-10-02 — End: ?
  Administered 2018-10-04 – 2018-10-05 (×3): 1 mg via ORAL

## 2018-10-02 MED ORDER — LORAZEPAM 1 MG PO TAB
1 mg | ORAL | 0 refills | Status: CP
Start: 2018-10-02 — End: ?
  Administered 2018-10-03 – 2018-10-04 (×4): 1 mg via ORAL

## 2018-10-02 MED ORDER — LORAZEPAM 1 MG PO TAB
1 mg | ORAL | 0 refills | Status: AC
Start: 2018-10-02 — End: ?
  Administered 2018-10-02 – 2018-10-03 (×5): 1 mg via ORAL

## 2018-10-02 NOTE — Progress Notes
enoxaparin (LOVENOX) syringe 40 mg, 40 mg, Subcutaneous, QDAY(21)  fluoxetine (PROZAC) capsule 40 mg, 40 mg, Oral, QDAY  folic acid (FOLVITE) tablet 1 mg, 1 mg, Oral, QDAY  gabapentin (NEURONTIN) capsule 300 mg, 300 mg, Oral, TID  LORazepam (ATIVAN) tablet 1 mg, 1 mg, Oral, Q4H*    Followed by  Melene Muller ON 10/03/2018] LORazepam (ATIVAN) tablet 1 mg, 1 mg, Oral, Q6H*    Followed by  Melene Muller ON 10/04/2018] LORazepam (ATIVAN) tablet 1 mg, 1 mg, Oral, Q8H*    Followed by  [START ON 10/05/2018] LORazepam (ATIVAN) tablet 1 mg, 1 mg, Oral, Q12H*  prednisone (DELTASONE) tablet 40 mg, 40 mg, Oral, QDAY  QUEtiapine (SEROQUEL) tablet 200 mg, 200 mg, Oral, QHS  QUEtiapine (SEROQUEL) tablet 50 mg, 50 mg, Oral, BID  thiamine mononitrate tablet 100 mg, 100 mg, Oral, QDAY  vitamins, B complex tablet 1 tablet, 1 tablet, Oral, QDAY  vitamins, multiple tablet 1 tablet, 1 tablet, Oral, QDAY    Continuous Infusions:  PRN and Respiratory Meds:acetaminophen Q6H PRN, albuterol 0.5% PRN **AND** ipratropium bromide PRN, albuterol sulfate Q4H PRN, haloperidol Q4H PRN **OR** haloperidol Q4H PRN, hydrOXYzine Q8H PRN, melatonin QHS PRN, ondansetron Q6H PRN **OR** ondansetron (ZOFRAN) IV Q6H PRN, polyethylene glycol 3350 QDAY PRN       Physical Exam:  Vital Signs: Last Filed In 24 Hours Vital Signs: 24 Hour Range   BP: 145/78 (02/06 1134)  Temp: 36.7 ???C (98 ???F) (02/06 1134)  Pulse: 95 (02/06 1134)  Respirations: 17 PER MINUTE (02/06 1134)  SpO2: 94 % (02/06 1134)  SpO2 Pulse: 88 (02/05 1630)  Height: 172.7 cm (68) (02/05 1828) BP: (115-150)/(54-80)   Temp:  [36.2 ???C (97.2 ???F)-37.1 ???C (98.7 ???F)]   Pulse:  [70-102]   Respirations:  [14 PER MINUTE-22 PER MINUTE]   SpO2:  [93 %-100 %]    Intensity Pain Scale (Self Report): Asleep (10/02/18 0810)    Intake/Outpur Summary: (Last 24 hours)    Intake/Output Summary (Last 24 hours) at 10/02/2018 1216  Last data filed at 10/02/2018 1142  Gross per 24 hour   Intake 1124 ml   Output 1300 ml   Net -176 ml

## 2018-10-02 NOTE — Progress Notes
RT Adult Assessment Note    NAME:Charles Vaughan             MRN: [id number]             DOB:Jan 21, 1956          AGE: 63 y.o.  ADMISSION DATE: 09/30/2018             DAYS ADMITTED: LOS: 0 days    RT Treatment Plan:  Protocol Plan: Medications  Albuterol: MDI PRN  Albuterol/Ipratropium: Neb PRN    Protocol Plan: Procedures  PAP: Place a nursing order for IS Q1h While Awake for any of Lung Expansion indicators    Additional Comments:  Impressions of the patient: Pt sleepy answers some questions.  No respiratory distress noted.  RN aware of pt status states he had night time medications contributing to sleepy state.    Intervention(s)/outcome(s):   Patient education that was completed:   Recommendations to the care team:     Vital Signs:  Pulse: 102  RR: 18 PER MINUTE  SpO2: 97 %  O2 Device:    Liter Flow:    O2%:    Breath Sounds: Decreased;Clear (implies normal)  Respiratory Effort:

## 2018-10-02 NOTE — Progress Notes
Continuous Infusions:  PRN and Respiratory Meds:acetaminophen Q6H PRN, albuterol 0.5% PRN **AND** ipratropium bromide PRN, albuterol sulfate Q4H PRN, haloperidol Q4H PRN **OR** haloperidol Q4H PRN, hydrOXYzine Q8H PRN, melatonin QHS PRN, ondansetron Q6H PRN **OR** ondansetron (ZOFRAN) IV Q6H PRN, polyethylene glycol 3350 QDAY PRN                         Vital Signs:  Last Filed                Vital Signs: 24 Hour Range   BP: 118/54 (02/06 0430)  Temp: 36.4 ???C (97.5 ???F) (02/06 0430)  Pulse: 93 (02/06 0724)  Respirations: 18 PER MINUTE (02/06 0430)  SpO2: 94 % (02/06 0430)  SpO2 Pulse: 88 (02/05 1630)  Height: 172.7 cm (5' 8) (02/05 1828)  BP: (115-153)/(54-94)   Temp:  [36.4 ???C (97.5 ???F)-37.1 ???C (98.7 ???F)]   Pulse:  [70-102]   Respirations:  [14 PER MINUTE-27 PER MINUTE]   SpO2:  [93 %-100 %]     Intensity Pain Scale (Self Report): Asleep (10/02/18 0810) Vitals:    09/30/18 1930 10/01/18 1200 10/01/18 1828   Weight: 70.3 kg (155 lb) 70.3 kg (155 lb) 75 kg (165 lb 6.4 oz)       Intake/Output Summary: (Last 24 hours)    Intake/Output Summary (Last 24 hours) at 10/02/2018 0820  Last data filed at 10/02/2018 0430  Gross per 24 hour   Intake 550 ml   Output 700 ml   Net -150 ml         Physical Exam    General Appearance: no acute distress, A&Ox3  HEENT: EOMI, MM-moist  Neck: no JVD, no carotid bruits  RESP: lungs CTAB, no rales, rhonchi, or wheezing  Cardiac: regular rate and rhythm. Normal S1 & S2, no S3 or S4, no rub. no cardiac murmurs noted  Abdominal Exam: bowel sounds present, soft, non-tender, non-distended  Neurologic Exam: no focal neurologic deficits noted  Extremities: No LE edema. 2+ pulses  Skin: warm & intact. No obvious lesions noted    Laboratory Review:   CBC w/Diff    Lab Results   Component Value Date/Time    WBC 5.5 10/02/2018 04:33 AM    RBC 4.19 (L) 10/02/2018 04:33 AM    HGB 12.2 (L) 10/02/2018 04:33 AM    HCT 36.2 (L) 10/02/2018 04:33 AM    MCV 86.3 10/02/2018 04:33 AM

## 2018-10-02 NOTE — Progress Notes
SPEECH-LANGUAGE PATHOLOGY  NO TREATMENT NOTE     Received and appreciate cognitive evaluation orders. Per EMR, pt is having active hallucinations today. Recommend holding cognitive evaluation until encephalopathy/hallucinations resolve in order to glean an accurate assessment of pt's cognitive functioning. SLP will continue to follow and complete evaluation once appropriate.        Therapist: Camillo Flaming , L/CCC-SLP (Pager [phone]; [phone])  Date: 10/02/2018

## 2018-10-03 ENCOUNTER — Inpatient Hospital Stay: Admit: 2018-10-03 | Discharge: 2018-10-03 | Payer: 59

## 2018-10-03 LAB — CBC AND DIFF: Lab: 4.2 M/UL — ABNORMAL LOW (ref 4.4–5.5)

## 2018-10-03 LAB — BASIC METABOLIC PANEL
Lab: 10 mg/dL — ABNORMAL HIGH (ref >60)
Lab: 139 MMOL/L — ABNORMAL LOW (ref >60)
Lab: 4 MMOL/L — ABNORMAL HIGH (ref >60)

## 2018-10-03 LAB — TROPONIN-I: Lab: 0 ng/mL (ref 0.0–0.05)

## 2018-10-03 MED ORDER — IPRATROPIUM BROMIDE 0.02 % IN SOLN
.5 mg | RESPIRATORY_TRACT | 0 refills | Status: DC | PRN
Start: 2018-10-03 — End: 2018-10-03

## 2018-10-03 MED ORDER — DEXTROSE 5%-0.45% SODIUM CHLORIDE IV SOLP
1000 mL | INTRAVENOUS | 0 refills | Status: AC
Start: 2018-10-03 — End: ?
  Administered 2018-10-03 – 2018-10-05 (×4): 1000 mL via INTRAVENOUS

## 2018-10-03 MED ORDER — ALBUTEROL SULFATE 2.5 MG/0.5 ML IN NEBU
2.5 mg | RESPIRATORY_TRACT | 0 refills | Status: DC | PRN
Start: 2018-10-03 — End: 2018-10-03
  Administered 2018-10-03: 18:00:00 2.5 mg via RESPIRATORY_TRACT

## 2018-10-03 MED ORDER — BISACODYL 10 MG/30 ML RE ENEM
10 mg | Freq: Once | RECTAL | 0 refills | Status: CP
Start: 2018-10-03 — End: ?
  Administered 2018-10-03: 22:00:00 10 mg via RECTAL

## 2018-10-03 MED ORDER — ALBUTEROL SULFATE 2.5 MG/0.5 ML IN NEBU
2.5 mg | Freq: Four times a day (QID) | RESPIRATORY_TRACT | 0 refills | Status: DC | PRN
Start: 2018-10-03 — End: 2018-10-03

## 2018-10-03 MED ORDER — IBUPROFEN 600 MG PO TAB
600 mg | ORAL | 0 refills | Status: DC | PRN
Start: 2018-10-03 — End: 2018-10-15
  Administered 2018-10-08: 14:00:00 600 mg via ORAL

## 2018-10-03 MED ORDER — NITROGLYCERIN 0.4 MG SL SUBL
.4 mg | SUBLINGUAL | 0 refills | Status: DC | PRN
Start: 2018-10-03 — End: 2018-10-15
  Administered 2018-10-03 – 2018-10-14 (×6): 0.4 mg via SUBLINGUAL

## 2018-10-03 MED ORDER — ALBUTEROL SULFATE 2.5 MG/0.5 ML IN NEBU
2.5 mg | Freq: Four times a day (QID) | RESPIRATORY_TRACT | 0 refills | Status: DC | PRN
Start: 2018-10-03 — End: 2018-10-07
  Administered 2018-10-04 – 2018-10-06 (×9): 2.5 mg via RESPIRATORY_TRACT

## 2018-10-03 MED ORDER — GABAPENTIN 300 MG PO CAP
600 mg | Freq: Three times a day (TID) | ORAL | 0 refills | Status: DC
Start: 2018-10-03 — End: 2018-10-15
  Administered 2018-10-03 – 2018-10-15 (×36): 600 mg via ORAL

## 2018-10-03 MED ORDER — IPRATROPIUM BROMIDE 0.02 % IN SOLN
.5 mg | Freq: Four times a day (QID) | RESPIRATORY_TRACT | 0 refills | Status: DC | PRN
Start: 2018-10-03 — End: 2018-10-07
  Administered 2018-10-04 – 2018-10-06 (×9): 0.5 mg via RESPIRATORY_TRACT

## 2018-10-03 MED ORDER — IPRATROPIUM BROMIDE 0.02 % IN SOLN
.5 mg | RESPIRATORY_TRACT | 0 refills | Status: DC | PRN
Start: 2018-10-03 — End: 2018-10-03
  Administered 2018-10-03: 18:00:00 0.5 mg via RESPIRATORY_TRACT

## 2018-10-03 NOTE — Progress Notes
OCCUPATIONAL THERAPY  ASSESSMENT NOTE      Name: Charles Vaughan            MRN: [id number]                DOB: August 17, 1956          Age: 63 y.o.  Admission Date: 09/30/2018             LOS: 1 day      Mobility  Progressive Mobility Level: Walk in room  Distance Walked (feet): 15 ft  Level of Assistance: Assist X1  Assistive Device: Walker  Time Tolerated: 11-30 minutes  Activity Limited By: Fatigue;Shortness of air;Mental Status Variability    Subjective  Pertinent Dx per Physician: history of anxiety disorder & schizophrenia, ETOH and BZD use disorder, CAD s/p PCI in 05/2018 who presented to the ED with Chest Pain  Precautions: Falls  Pain / Complaints: Patient agrees to participate in therapy    Objective  Psychosocial Status: Willing and Cooperative to Participate  Persons Present: Constant Observer;Nursing Staff    Home Living  Type of Home: House  Home Layout: One Level(4 step entry)  Financial risk analyst / Tub: Tub Only  Home Equipment: (none)    Prior Function  Level Of Independence: Independent with ADLs and functional transfers  Lives With: Friend(s)    ADL's  Where Assessed: In Bathroom  Toileting Assist: Minimal Assist  Toileting Deficits: Perineal Hygiene;Grab Bar Use  Functional Transfer Assist: Minimal Assist  Functional Transfer Deficits: Steadying;Verbal Cueing;Increased Time to Complete;Toilet Transfer  Comment: Patient on toilet upon entry. Able to perform peri care while seated on toilet with min assist to steady. Sit to stand from toilet with min assist and loss of balance backwards in standing requiring min assist from therpaist to correct. Patient short of breath after standing. Ambulated to bed with min assist and RW. Verbal cues for walker safety. Left on edge of bed with CO and RN present.    Activity Tolerance  Endurance: 2/5 Tolerates 10-20 Minutes Exercise w/Multiple Rests  Sitting Balance: 3+/5 Sits w/o UE Support for 30 Seconds or Greater    Cognition  Overall Cognitive Status: Impaired Comprehension: Psychiatric Issue;Sedated/Medicated  Expression: Mumbling/Slurred  Social Interaction: Increased Time to Adjust;Meds Required  Problem Solving: Decreased Judgment/Safety;Direction Following Assist  Cognition Comment: Patient able to state first name and month of birth date only. Able to state that he is actively  hallucinating during therapy session.    Education  Persons Educated: Patient  Barriers To Learning: Cognitive Deficits  Topics: Role of OT, Goals for Therapy  Goal Formulation: With Patient    Assessment  Assessment: Decreased ADL Status;Decreased Cognition;Decreased Safe/Judg during ADL;Decreased Endurance;Decreased Self-Care Trans;Decreased High-Level ADLs  Prognosis: Good;w/Cont OT s/p Acute Discharge  Goal Formulation: Patient  Comments: Unsure of patients cognitive baseline however anticipate as patient's cognition improves, he will progress well with therapy. Patient will likely require inpatient setting at discharge.    AM-PAC 6 Clicks Daily Activity Inpatient  Putting on and taking off regular lower body clothes?: A Lot  Bathing (Including washing, rinsing, drying): A Lot  Toileting, which includes using toilet, bedpan, or urinal: A Little  Putting on and taking off regular upper body clothing: A Little  Taking care of personal grooming such as brushing teeth: A Little  Eating meals?: None  Daily Activity Raw Score: 17  Standardized (t-scale) score: 37.26  CMS 0-100% Score: 50.11  CMS G Code Modifier: CK    Plan  Progress: Progressing Toward Goals  OT Frequency: 3-5x/week  OT Plan for Next Visit: standing at sink for grooming, lower body dressing on edge of bed    ADL Goals  Patient Will Perform Grooming: Standing at Sink;w/ Stand By Assist  Patient Will Perform LE Dressing: w/ Stand By Assist    Functional Transfer Goals  Pt Will Perform All Functional Transfers: w/ Stand By Assist    OT Discharge Recommendations  Recommendation: Inpatient setting

## 2018-10-03 NOTE — Progress Notes
RT Adult Assessment Note    NAME:Solon Wisdom Rickey             MRN: [id number]             DOB:1955-12-11          AGE: 63 y.o.  ADMISSION DATE: 09/30/2018             DAYS ADMITTED: LOS: 1 day    RT Treatment Plan:  Protocol Plan: Medications  Albuterol: MDI PRN  Albuterol/Ipratropium: Neb Q4h While Awake & PRN    Protocol Plan: Procedures  PAP: Place a nursing order for IS Q1h While Awake for any of Lung Expansion indicators    Additional Comments:  Impressions of the patient: Pt called for breathing treatment, SOA upon arrival, wheezing indicated   Intervention(s)/outcome(s): a/a changed to Q4 while awake   Patient education that was completed: n/a  Recommendations to the care team: n/a    Vital Signs:  Pulse: 117  RR: 20 PER MINUTE  SpO2: (!) 91 %  O2 Device:    Liter Flow:    O2%: 21 %  Breath Sounds:    Respiratory Effort: SOA (Short of Air)

## 2018-10-03 NOTE — Progress Notes
Time Frame: Within 72 hours                   Ria Comment   [phone]  Dietetic Intern

## 2018-10-04 LAB — MAGNESIUM: Lab: 2 mg/dL — ABNORMAL LOW (ref 1.6–2.6)

## 2018-10-04 LAB — PHOSPHORUS: Lab: 4.9 mg/dL — ABNORMAL HIGH (ref >60)

## 2018-10-04 NOTE — Progress Notes
General Progress Note    Name:  Charles Vaughan   Today's Date:  10/04/2018  Admission Date: 09/30/2018  LOS: 2 days                     Assessment/Plan:    Principal Problem:    Encephalopathy acute  Active Problems:    Chest pain    Hallucination    COPD exacerbation (HCC)    Non compliance w medication regimen    63 year-old male with PMHx of anxiety disorder, MDD, ETOH and BZD use disorder, CAD s/p DES-LAD (05/2018) admitted 2/5 with chest pain, acute metabolic/toxic encephalopathy, benzodiazepine / alcohol withdrawal.     Acute metabolic / toxic encephalopathy with hallucinations   ETOH and BZD use disorder   - Likely secondary to BZD / alcohol withdrawal   - Psychiatry consulted, appreciate recommendations   - Continue PTA Seroquel 200 mg QHS; Prozac increased to 40 m daily   - Actively hallucinating on 2/7 and was started on ativan taper and increased gabapentin dose to 600 mg TID  - Haldol PRN available  - Continue folic acid, thiamine, multivitamin and vitamin B complex   - Daughter concerned about patient underline cognitive status. Consulted SLP for cognitive evaluation; may need to wait till completion of ativan taper.     Chest pain, atypical for acute coronary syndrome   - Suspect secondary to COPD exacerbation vs underlying angina with history of non-compliance to medication regimen, despite stent placement 05/2018. Other differentials also includes aspiration as well with failed swallow study.   - Troponin negative. ECG with no signs of acute ischemia  - Cardiology consulted: loaded with plavix and plan to continue Plavix 75 mg daily to complete 1 year  - Continue aspirin 81 mg daily and atorvastatin 40 mg daily   ???  COPD exacerbation, mild   - Continue duoneb q6 hours and PRN and prednisone x 5 days (started 2/5)   ???  At risk for aspiration  - Due to severe encephalopathy   - Failed swallow study on 10/03/2018.   - CXR ordered and reviewed. No overtly new cardiopulmonary findings. Respiratory: coarse breath sounds bilaterally, no wheezing  Abodmen: hypoactive bowel sounds, slightly distended  Extremities: no edema, cyanosis or clubbing. Distal pulses intact.   Skin: no obvious rashes or lesions       Lab Review  Pertinent labs reviewed    Point of Care Testing  (Last 24 hours)      Radiology and other Diagnostics Review:    Pertinent radiology reviewed.    Michele Mcalpine, MD   Med Private T Pager [phone]

## 2018-10-05 LAB — CBC: Lab: 33 g/dL — ABNORMAL LOW (ref 32.0–36.0)

## 2018-10-05 LAB — BASIC METABOLIC PANEL: Lab: 8.7 mg/dL — ABNORMAL LOW (ref >60)

## 2018-10-05 MED ORDER — PANTOPRAZOLE 40 MG PO TBEC
40 mg | Freq: Every day | ORAL | 0 refills | Status: DC
Start: 2018-10-05 — End: 2018-10-15
  Administered 2018-10-06 – 2018-10-15 (×10): 40 mg via ORAL

## 2018-10-05 NOTE — Progress Notes
General: Alert, oriented to person, place, in no distress  HEENT: NCAT, EOMI   Cardiac: tachycardic, regular, no murmur appreciated, normal S1S2   Respiratory: coarse breath sounds bilaterally, no wheezing  Abodmen: hypoactive bowel sounds, slightly distended  Extremities: no edema, cyanosis or clubbing. Distal pulses intact.   Skin: no obvious rashes or lesions   Neuro: Non focal  Psych: admitted hallucinations      Lab Review  Pertinent labs reviewed    Point of Care Testing  (Last 24 hours)      Radiology and other Diagnostics Review:    Pertinent radiology reviewed.    Michele Mcalpine, MD   Med Private T Pager [phone]

## 2018-10-05 NOTE — Care Plan
Problem: Discharge Planning  Goal: Participation in plan of care  Outcome: Goal Ongoing  Goal: Knowledge regarding plan of care  Outcome: Goal Ongoing  Goal: Prepared for discharge  Outcome: Goal Ongoing     Problem: Self-Care Deficit  Goal: Maximize ADL functioning  Outcome: Goal Ongoing     Problem: Nutrition Deficit  Goal: Adequate nutritional intake  Outcome: Goal Ongoing     Problem: Mobility/Activity Intolerance  Goal: Maximize functional ADL's and mobility outcomes  Outcome: Goal Ongoing     Problem: Falls, High Risk of  Goal: Absence of falls-Adult Patient  Outcome: Goal Ongoing

## 2018-10-05 NOTE — Care Plan
Problem: Falls, High Risk of  Goal: Absence of falls-Adult Patient  Outcome: Goal Ongoing   Call light button within patient's reach.

## 2018-10-06 LAB — VITAMIN B12: Lab: 473 pg/mL — ABNORMAL LOW (ref >60)

## 2018-10-06 LAB — TROPONIN-I: Lab: 0 ng/mL (ref 0.0–0.05)

## 2018-10-06 LAB — FOLATE, SERUM: Lab: >22 ng/mL — ABNORMAL LOW (ref >3.9)

## 2018-10-06 LAB — CBC
Lab: 11 g/dL — ABNORMAL LOW (ref >60)
Lab: 13 K/UL — ABNORMAL HIGH (ref 4.5–11.0)

## 2018-10-06 LAB — BASIC METABOLIC PANEL: Lab: 15 mg/dL — ABNORMAL HIGH (ref 7–25)

## 2018-10-06 LAB — MAGNESIUM: Lab: 2.1 mg/dL — ABNORMAL HIGH (ref 1.6–2.6)

## 2018-10-06 MED ORDER — ALBUTEROL SULFATE 2.5 MG/0.5 ML IN NEBU
2.5 mg | RESPIRATORY_TRACT | 0 refills | Status: DC | PRN
Start: 2018-10-06 — End: 2018-10-15
  Administered 2018-10-07 – 2018-10-15 (×26): 2.5 mg via RESPIRATORY_TRACT

## 2018-10-06 MED ORDER — HYDROXYZINE HCL 25 MG PO TAB
25-50 mg | ORAL | 0 refills | Status: DC | PRN
Start: 2018-10-06 — End: 2018-10-12
  Administered 2018-10-06: 22:00:00 50 mg via ORAL
  Administered 2018-10-07: 19:00:00 25 mg via ORAL
  Administered 2018-10-07 (×2): 50 mg via ORAL
  Administered 2018-10-08 – 2018-10-09 (×10): 25 mg via ORAL
  Administered 2018-10-10: 10:00:00 50 mg via ORAL
  Administered 2018-10-10 – 2018-10-12 (×14): 25 mg via ORAL

## 2018-10-06 MED ORDER — IPRATROPIUM BROMIDE 0.02 % IN SOLN
.5 mg | RESPIRATORY_TRACT | 0 refills | Status: DC | PRN
Start: 2018-10-06 — End: 2018-10-15
  Administered 2018-10-07 – 2018-10-15 (×26): 0.5 mg via RESPIRATORY_TRACT

## 2018-10-06 MED ORDER — SIMETHICONE 80 MG PO CHEW
80 mg | ORAL | 0 refills | Status: DC | PRN
Start: 2018-10-06 — End: 2018-10-15
  Administered 2018-10-07 – 2018-10-15 (×2): 80 mg via ORAL

## 2018-10-06 NOTE — Progress Notes
PHYSICAL THERAPY  PROGRESS NOTE        Name: Charles Vaughan            MRN: [id number]                DOB: September 08, 1955          Age: 63 y.o.  Admission Date: 09/30/2018             LOS: 4 days        Mobility  Patient Turn/Position: Self  Progressive Mobility Level: Walk in hallway  Distance Walked (feet): 125 ft  Level of Assistance: Assist X1  Assistive Device: Walker  Time Tolerated: 11-30 minutes  Activity Limited By: Weakness;Fatigue    Subjective  Significant hospital events: PMH: COPD, ETOH and BZD abuse, and possible underlying psychiatric disorder.  Pt admitted with atypical chest pain, presenting with confusion/encephalopathy and currently AWAS.  Mental / Cognitive Status: Alert;Cooperative;Follows Commands  Persons Present: (telesitter)  Pain: Patient complains of pain;Patient does not rate pain  Pain Location: Abdomen;Chest;Head  Pain Description: Aching  Pain Interventions: Patient agrees to participate in therapy  Comments: Room air.  Ambulation Assist: Independent Mobility in [employer] without Device  Home Situation: Lives wth Roommate  Type of Home: House  Entry Stairs: 3-5 Stairs(4)  In-Home Stairs: Able to Live on One Level  Comments: 2/10-states unsure where his roommates are currently and unsure if they would be able to assist him at home.     Bed Mobility/Transfer  Bed Mobility: Supine to Sit: Standby Assist;Head of Bed Elevated  Bed Mobility: Sit to Supine: Standby Assist  Transfer Type: Sit to/from Stand  Transfer: Assistance Level: To/From;Bed;Toilet;Minimal Assist  Transfer: Assistive Device: Nurse, adult  Transfers: Type Of Assistance: Materials engineer;For Safety Considerations;For Balance  End Of Activity Status: In Bed;Nursing Notified;Instructed Patient to Request Assist with Mobility    Balance  Sitting Balance: Static Sitting Balance;Dynamic Sitting Balance;No UE Support;Standby Assist  Standing Balance: Static Standing Balance;Dynamic Standing Balance;2 UE support;Minimal Assist    Gait

## 2018-10-06 NOTE — Progress Notes
General Progress Note    Name:  Charles Vaughan   Today's Date:  10/06/2018  Admission Date: 09/30/2018  LOS: 4 days                     Assessment/Plan:      17M w/ anxiety disorder, MDD, ETOH and BZD use disorder, CAD s/p DES-LAD (05/2018) prsented to ED w/ 7-10d of atypical, exertional chest pain, suspected secondary to COPD exacerbation and angina in setting of non-compliance to medications despite recent PCI. Admitted for further mgmt.    Acute metabolic / toxic encephalopathy with hallucinations   ETOH and BZD use disorder   - Likely secondary to BZD / alcohol withdrawal   - Psychiatry consulted, appreciate recommendations   - Continue PTA Seroquel 200 mg QHS; Prozac increased to 40 m daily   - Actively hallucinating on 2/7; now improved  Plan:  - doing well since starting ativan taper and gabapentin 600 mg TID; continue both  - Haldol PRN available  - Continue folic acid, thiamine, multivitamin and vitamin B complex   - Daughter concerned about patient underline cognitive status.   - Consulted SLP for cognitive evaluation- speech OK, but pt having persistent dysphagia (currently on full liquids)  ???  Chest pain, atypical for acute coronary syndrome   - Suspect secondary to COPD exacerbation vs underlying angina with history of non-compliance to medication regimen, despite stent placement 05/2018.???Other differentials also includes aspiration as well with failed swallow study.???  - Troponin negative. ECG with no signs of acute ischemia  - Cardiology consulted: loaded with plavix  Plan:  - continue Plavix 75 mg daily to complete 1 year  - Continue aspirin 81 mg daily and atorvastatin 40 mg daily   - rx for anxiety w/ attarax (INCREASED FREQUENCY), ativan     Dysphagia  - high risk of aspiration- due to severe encephalopathy   - Failed swallow study on 10/03/2018. Repeated again 02/10  Plan:  - CONTINUE FULL LIQUIDS  - PLANNING FOR VIDEO SWALLOW  ???  Abdominal pain - ?related to gas, heartburn, constipation  Plan:  - continue PPI  - ADDED SIMETHICONE, MAALOX  - BOWEL REGIMEN ON BOARD  ???  COPD exacerbation, mild   - CXR ordered and reviewed. No overtly new cardiopulmonary findings.   - Acapella TID and PRN, IS  - Continue duoneb???q6 hours and???PRN and prednisone x 5 days (started 2/5)???  ???  Leukocytosis, resolved  - No other signs of infection, likely due to steroids, resolving      DVT ppx: lovenox    Fluids: n/a  Electrolytes: monitor daily  Nutrition: full liquids  PT/OT: consulted      Dispo: pending psych eval after mentation returns to baseline    Total floor/unit time (reviewing and writing notes, examining the patient, reviewing test results etc) spent was 35 minutes of which > 50% was spent in care coordination and bedside counseling (explaining treatment options, disease processes, laboratory/imaging results, prognosis, risks and benefits of treatment options, medication side effects, importance of compliance with treatment, risk factor reduction, follow up with primary care physician).      Staff name:  Marcine Matar, MD Date:  10/06/2018   Med-private Team T  Team Pager [phone]  ________________________________________________________________________    Subjective  No acute events o/n. V/s and labs ok. Pt is feeling well. Tired but better than yesterday. Still having intermittent chest pain (none at this time). Thinks it's related to anxiety. Has attarax available  for sx; has been helping- but requesting one more dose. No dyspnea, n/v/c/d, abd prain, neuropathy, HA. Still having some difficulty swallowing- related to AMS.     Medications  Scheduled Meds:ipratropium bromide (ATROVENT) 0.02 % nebulizer solution 0.5 mg, 0.5 mg, Inhalation, QID & PRN    And  albuterol 0.5% (PROVENTIL) nebulizer solution 2.5 mg, 2.5 mg, Inhalation, QID & PRN  aspirin chewable tablet 81 mg, 81 mg, Oral, QDAY  atorvastatin (LIPITOR) tablet 40 mg, 40 mg, Oral, QDAY Serum Folate >22.3 >3.9 NG/ML   THYROID STIMULATING HORMONE-TSH    Collection Time: 10/06/18  5:14 AM   Result Value Ref Range    TSH 1.20 0.35 - 5.00 MCU/ML   TROPONIN-I    Collection Time: 10/06/18  3:51 PM   Result Value Ref Range    Troponin-I 0.01 0.0 - 0.05 NG/ML

## 2018-10-06 NOTE — Progress Notes
Heart Failure Nursing Progress Note    Admission Date: 09/30/2018  LOS: 4 days    Admission Weight: 70.3 kg (155 lb)        Most recent weights (inpatient):   Vitals:    10/01/18 1828 10/05/18 0435 10/06/18 0500   Weight: 75 kg (165 lb 6.4 oz) 75.5 kg (166 lb 6.4 oz) 78 kg (171 lb 15.3 oz)     Weight change from previous day: -2.5kg    Fluid restriction ordered: 1.5L    Intake/Output Summary: (Last 24 hours)    Intake/Output Summary (Last 24 hours) at 10/06/2018 0757  Last data filed at 10/06/2018 0400  Gross per 24 hour   Intake 1720 ml   Output 1900 ml   Net -180 ml       Is patient incontinent No    Anticipated discharge date: TBD  Discharge goals: Heart Failure goals      Daily Assessment of Patient Stated Goals:    Short Term Goal Identified by patient (Short Term=during hospitalization):  Speech consult

## 2018-10-07 ENCOUNTER — Inpatient Hospital Stay: Admit: 2018-10-07 | Discharge: 2018-10-07 | Payer: 59

## 2018-10-07 LAB — CBC: Lab: 9.6 K/UL — ABNORMAL LOW (ref 4.5–11.0)

## 2018-10-07 LAB — MAGNESIUM: Lab: 2.1 mg/dL — ABNORMAL LOW (ref 1.6–2.6)

## 2018-10-07 LAB — BASIC METABOLIC PANEL: Lab: 140 MMOL/L — ABNORMAL LOW (ref 137–147)

## 2018-10-07 MED ORDER — CALCIUM CARBONATE 200 MG CALCIUM (500 MG) PO CHEW
500 mg | Freq: Four times a day (QID) | ORAL | 0 refills | Status: DC
Start: 2018-10-07 — End: 2018-10-15
  Administered 2018-10-08 – 2018-10-15 (×32): 500 mg via ORAL

## 2018-10-07 MED ORDER — BARIUM SULFATE 40 % (W/V) PO POWD
70 mL | Freq: Once | ORAL | 0 refills | Status: CP
Start: 2018-10-07 — End: ?
  Administered 2018-10-07: 17:00:00 70 mL via ORAL

## 2018-10-07 MED ORDER — ALUMINUM-MAGNESIUM HYDROXIDE 200-200 MG/5 ML PO SUSP
30 mL | Freq: Three times a day (TID) | ORAL | 0 refills | Status: DC | PRN
Start: 2018-10-07 — End: 2018-10-15

## 2018-10-07 MED ORDER — BARIUM SULFATE 40 % (W/V), 30% (W/W) PO PSTE
30 mL | Freq: Once | ORAL | 0 refills | Status: CP
Start: 2018-10-07 — End: ?
  Administered 2018-10-07: 17:00:00 30 mL via ORAL

## 2018-10-07 MED ORDER — BARIUM SULFATE 40 % (W/V) PO SUSP
70 mL | Freq: Once | ORAL | 0 refills | Status: CP
Start: 2018-10-07 — End: ?
  Administered 2018-10-07: 17:00:00 70 mL via ORAL

## 2018-10-07 NOTE — Consults
SPEECH-LANGUAGE PATHOLOGY  VIDEOSWALLOW ASSESSMENT     EVALUATION SUMMARY  Videoswallow Summary*: Videoswallow evaluation completed this date. Mild to moderate oropharyngeal dysphagia present. Consistent penetration noted with thin and nectar thick liquids during the swallow (at times to right above vocal cords). High penetration also noted with purees inconsistently. Anticipate poor coordination of breathing/swallowing 2* COPD resulted in delayed laryngeal vestibule closure during the swallow. Pt also appeared to have weakness as observed from reduced hyolaryngeal elevation from an unknown etiology. Question is swallow function may be close if not at baseline. Anticipate best option for management is to return to baseline diet and monitor for tolerance. If any adverse pulmonary changes noted further discussion with patient re: swallow function will need to be completed. Please see details below.     RECOMMENDATIONS:  Regular diet with thin liquids with known risk for penetration. Frequent oral care and use of slow rate/small bites/sips.   Medications as tolerated.     MBSImp Scale:   Lip closure for intraoral bolus containment resulted in no labial escape. Tongue control during bolus hold maintained a cohesive bolus held between tongue to palate seal. Bolus preparation and mastication could not be assessed. No solid was given do to safety concerns or logistical reasons not related to mastication. Bolus transport/lingual motion demonstrated delayed initiation of tongue motion. Oral residue was a trace, lining oral structures.     Initiation of the pharyngeal swallow occured when the bolus head was in the pyriform sinuses.(in the laryngeal vesituble. ) Soft palate elevation resulted in no bolus between the soft palate and the pharyngeal wall. Laryngeal elevation was incomplete, as indicated through minimal superior movement on thyroid cartilage with minimal approximation of the arytenoids to the epiglottic petiole. Anterior hyoid excursion demonstrated partial anterior movement. Epiglottic movement resulted in partial inversion. Laryngeal vestibular closure was incomplete, with narrow column of air/contrast noted within the laryngeal vestibule at the height of the swallow. Pharyngeal stripping wave was present, but diminished. Pharyngeal contraction   Pharyngoesophageal segment opening was completely distended for complete duration with no obstruction of bolus flow. Tongue base retraction allowed a narrow column of contrast of air between the retracted tongue base and the posterior pharyngeal wall. Pharyngeal residue was a collection of residue within or on pharyngeal structures. Esophageal clearance in the upright position resulted in esophageal retention with incidence of retrograde bolus flow below the pharyngoesophageal segment.(? Cricopharyngeal bar )     Swallow Recommendations*  PO: Regular, Thin Liquids    Plan*: (Will continue to follow 1-2x per week)  Prognosis*: Fair  NOMS Dysphagia Rating*: 4-Mild-Moderate Dysphagia -Swallow safe but usually requires mod cues to use compensatory strategies &/or has mod diet restrictions &/or still requires tube feeding &/or oral supplements.    Penetration Aspiration Scale*: 5 - Material enters laryngeal vestibule, contacts vocal folds & is not ejected    Objective*29M w/ anxiety disorder, MDD, ETOH and BZD use disorder, CAD s/p DES-LAD (05/2018) prsented to ED w/ 7-10d of atypical, exertional chest pain, suspected secondary to COPD exacerbation and angina in setting of non-compliance to medications despite recent PCI. Admitted for further mgmt.    IMPRESSION  Zones of linear atelectasis or scarring in the left lower lobe.  No acute cardiopulmonary abnormalities.    Hearing: WFL  Lives With: Friend(s)  Psychosocial Status: Willing and Cooperative to Participate  Persons Present: None    Subjective*

## 2018-10-07 NOTE — Case Management (ED)
Request for Benefits    Received request from Dellia Beckwith, [facility] to check SNF benefits for patient.    SNF Benefits:  40 days allowed per calendar year.  $0.00 co-pay  Deductible applies (Annual deductible of $350 has been met)  90% Co-insurance for physician visits and room & board.    Co-Insurance OOP Limit: $6,000  Met: $5,551.70    Health Fund: $0.00 remaining    Call Reference # [id number]    Scarlette Ar  Case Management Assistant    For further assistance please contact SWCM *[phone]

## 2018-10-07 NOTE — Progress Notes
Gait Distance: 130 feet(+110)  Gait: Assistance Level: Minimal Assist  Gait: Assistive Device: Roller Walker  Gait: Descriptors: Pace: Slow;Forward trunk flexion;Decreased knee flexion in pre-swing RLE;Decreased knee flexion in pre-swing  LLE;Step-To Gait;Loss of balance;Variable step length  Comments: Patient with loss of balace posteriorly (patient back on heels) at end of first bout of ambulation.  Activity Limited By: [employer]    Wheelchair Mobility       Activity/Exercise   Sit to stand x5 reps from edge of bed. Patient exhibits significant trunk flexion, encouraged to keep chest up during the transfer.    Education  Persons Educated: Patient  Patient Barriers To Learning: None Noted  Interventions: Repetition of Instructions;Demonstration Provided  Teaching Methods: Verbal Instruction;Demonstration  Patient Response: Verbalized Understanding;Return Demonstration  Topics: Plan/Goals of PT Interventions;Use of Assistive Device/Orthosis;Mobility Progression;Safety Awareness;Up with Assist Only;Importance of Increasing Activity;Recommend Continued Therapy  Comments: Educated patient on proper body mechanics during sit to stand.    Assessment/Progress  Impaired Mobility Due To: Decreased Strength;Impaired Balance;Decreased Activity Tolerance;Medical Status Limitation  Impaired Strength Due To: Medical Status Limitation  Assessment/Progress: Should Improve w/ Continued PT  Comments: Patient requires assistance with all mobility for balance, will benefit from continued mobilization with PT.    AM-PAC 6 Clicks Basic Mobility Inpatient  Turning from your back to your side while in a flat bed without using bed rails: A Little  Moving from lying on your back to sitting on the side of a flatbed without using bedrails : A Little  Moving to and from a bed to a chair (including a wheelchair): A Little  Standing up from a chair using your arms (e.g. wheelchair, or bedside chair): A Little To walk in hospital room: A Little  Climbing 3-5 steps with a railing: A Lot  Raw Score: 17  Standardized (T-scale) Score: 39.67  Basic Mobility CMS 0-100%: 43.83  CMS G Code Modifier for Basic Mobility: CK    Goals  Goal Formulation: With Patient  Time For Goal Achievement: 5 days  Patient Will Go Supine To/From Sit: Independently  Patient Will Transfer Sit to Stand: New Goal, w/ Stand By Assist  Patient Will Ambulate: Greater than 200 Feet, w/ No Device  Patient Will Go Up / Down Stairs: 3-5 Stairs, w/ Stand By Assist    Plan  Treatment Interventions: Mobility Training;Endurance Training;Neuromuscular Reeducation  Plan Frequency: 5 Days per Week  PT Plan for Next Visit: Repetitive sit to stands encouraging proper mechanics. Progress gait distance with roller walker. Balance activities.    PT Discharge Recommendations  Recommendation: Inpatient setting  Patient Currently Requires Physical Assist With: Transfers;Ambulation;Stairs  Patient Currently Requires Supervision For: Mobility  Patient Currently Requires Equipment: Walker with wheels    Therapist: Ofilia Neas  Date: 10/07/2018

## 2018-10-08 LAB — BASIC METABOLIC PANEL: Lab: 138 MMOL/L — ABNORMAL HIGH (ref 137–147)

## 2018-10-08 LAB — CBC: Lab: 9.9 K/UL — ABNORMAL LOW (ref 4.5–11.0)

## 2018-10-08 LAB — MAGNESIUM: Lab: 2.1 mg/dL — ABNORMAL HIGH (ref >60)

## 2018-10-08 MED ORDER — BENZTROPINE 1 MG PO TAB
1 mg | Freq: Every evening | ORAL | 0 refills | Status: DC | PRN
Start: 2018-10-08 — End: 2018-10-15

## 2018-10-08 NOTE — Progress Notes
PHYSICAL THERAPY  PROGRESS NOTE        Name: Charles Vaughan            MRN: [id number]                DOB: [birth year]          Age: 63 y.o.  Admission Date: 09/30/2018             LOS: 6 days        Mobility  Patient Turn/Position: Self  Progressive Mobility Level: Walk in hallway  Distance Walked (feet): 100 ft  Level of Assistance: Assist X1  Assistive Device: Walker  Time Tolerated: 0-10 minutes  Activity Limited By: Fatigue;Patient request to stop;Shortness of air;Weakness;Mental Status Variability    Subjective  Significant hospital events: PMH: COPD, ETOH and BZD abuse, and possible underlying psychiatric disorder.  Pt admitted with atypical chest pain, presenting with confusion/encephalopathy and currently AWAS.  Mental / Cognitive Status: Alert;Oriented;To Person;To Place;Cooperative;Confused;Anxious  Persons Present: Nutritional therapist)  Ambulation Assist: Independent Mobility in [employer] without Device  Home Situation: Lives wth Roommate  Type of Home: House  Entry Stairs: 3-5 Stairs  In-Home Stairs: Able to Live on One Level  Comments: Patient stated that he was having hallucinations upon arrival for session; nursing confirmed they had given him medication to address. Patient remained heavily distracted throughout session, limiting gait progression due to safety concerns.    Bed Mobility/Transfer  Bed Mobility: Supine to Sit: Standby Assist;Head of Bed Elevated  Bed Mobility: Sit to Supine: Standby Assist  Transfer Type: Sit to/from Stand  Transfer: Assistance Level: To/From;Bed;Standby Assist  Transfer: Assistive Device: Nurse, adult  Transfers: Type Of Assistance: Materials engineer;For Balance;For Safety Considerations  End Of Activity Status: In Bed;Nursing Notified;Instructed Patient to Request Assist with Mobility;Instructed Patient to Use Call Light    Balance       Gait  Gait Distance: 100 feet  Gait: Assistance Level: Minimal Assist  Gait: Assistive Device: Nurse, adult Patient Will Go Supine To/From Sit: Independently  Patient Will Transfer Sit to Stand: New Goal, w/ Stand By Assist  Patient Will Ambulate: Greater than 200 Feet, w/ No Device  Patient Will Go Up / Down Stairs: 3-5 Stairs, w/ Stand By Assist    Plan  Treatment Interventions: Mobility Training;Balance Activities;Endurance Training  Plan Frequency: 5 Days per Week  PT Plan for Next Visit: Increase gait distance with walker, Stairs if appropriate.    PT Discharge Recommendations  Recommendation: Inpatient setting  Patient Currently Requires Physical Assist With: Transfers;Ambulation;Stairs  Patient Currently Requires Supervision For: Mobility  Patient Currently Requires Equipment: Walker with wheels    Therapist: Ofilia Neas  Date: 10/08/2018

## 2018-10-08 NOTE — Progress Notes
Psychiatric Consultation Progress Note    LOS: 6 days    Current Psychotropic Meds:   - Prozac 40mg  qday  - Gabapentin 600mg  TID  - Hydroxyzine 25-50mg  Q6H PRN  - Haldol 2mg  Q4H PRN PO/IM  - Seroquel 200mg  QHS      Psychiatric Assessment:  1. Capacity Evaluation - to refuse discharge to SNF.   2. Major Neurocognitive Disorder  1. MDD, recurrent, moderate  2. Unspecified anxiety disorder  3. 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   - Changing cogentin to 1mg  qhs PRN, concern that scheduled dosage may worsen cognition.   - Psychiatry will follow as needed.     #MOCA: 12/30 on 10/08/18    #Per SLP recommendations:  Recommend assist w/ finance & medication management and meal preparation upon discharge secondary to patient's current cognitive status. Assistance for safety awareness will also likely be required.     #Per PT recommendations:   Pt requires physical assistance with transfers, ambulation, stairs, requires supervision for mobility and requires a walker with wheels.     Seen and discussed with: Dr. Harlin Rain    Please feel free to contact us with any additional questions or concerns by paging the consult team between 8am and 5pm on weekdays and between 8am and 3pm on weekends at [phone]. Otherwise, page the psychiatry resident on call.    --------------------------------------------------------------------------------------------------------------------------------  Subjective:  Charles Vaughan was seen in his room this morning. Discussed team recommendations that pt d/c to SNF for further care. Pt notes if he were to discharge without SNF he could have further falls and weakness. He is agreeable to SNF after discharge at this time. He states mood is fine at polyethylene glycol 3350 (MIRALAX) packet 17 g, 1 packet, Oral, QDAY  QUEtiapine (SEROQUEL) tablet 200 mg, 200 mg, Oral, QHS  senna/docusate (SENOKOT-S) tablet 1 tablet, 1 tablet, Oral, BID  thiamine mononitrate tablet 100 mg, 100 mg, Oral, QDAY  vitamins, B complex tablet 1 tablet, 1 tablet, Oral, QDAY  vitamins, multiple tablet 1 tablet, 1 tablet, Oral, QDAY        PRN Medications:  acetaminophen Q6H PRN 650 mg at 10/08/18 0805, albuterol sulfate Q4H PRN 2 puff at 10/01/18 1159, aluminum/magnesium hydroxide TID w/ meals PRN, haloperidol Q4H PRN 2 mg at 10/08/18 1312 **OR** haloperidol Q4H PRN 2 mg at 10/03/18 0830, hydrOXYzine Q6H PRN 25 mg at 10/08/18 1312, ibuprofen Q6H PRN 600 mg at 10/08/18 0805, melatonin QHS PRN 3 mg at 10/06/18 2020, nitroglycerin Q5 MIN PRN 0.4 mg at 10/08/18 0805, ondansetron Q6H PRN **OR** ondansetron (ZOFRAN) IV Q6H PRN, polyethylene glycol 3350 QDAY PRN 17 g at 10/02/18 1014, simethicone Q6H PRN 80 mg at 10/07/18 1302      Mental Status Exam:  General/Constitutional: Pt appears stated age, with fair hygiene, and in no acute distress  Eye Contact: good  Behavior: calm  Speech: nl rate/tone/volume  Mood: fine  Affect: euthymic  Thought Process: linear and goal oriented  Thought Content: denies suicidal or homicidal ideations  Perception: denies auditory or visual hallucinations. No evidence of delusions.   Associations: intact  Insight: fair  Judgement: fair    Orientation: Alert and oriented to self, somewhat to place and time.   Recent and remote memory: intact  Attention  span and concentration: impaired  Cognition: impaired  Language: [place]  Fund of knowledge/vocabulary: average    Gait: Normal gait with normal arm swing      Focused Physical Exam:  ??? Musculoskeletal: moves all four extremities with ease  ??? Neuro: no tremors observed.        Olegario Shearer, MD

## 2018-10-08 NOTE — Progress Notes
Overall Cognitive Status: Impaired  Comprehension: (repeated cueing)  Expression: Mumbling/Slurred;Speech Off Task  Social Interaction: Increased Time to Adjust  Problem Solving: Decreased Judgment/Safety(impulsive)  Orientation: Alert & Oriented x1(thinks he's at [facility])  Attention: Distractable    UE AROM  Overall BUE AROM WNL: Yes  Coordination: Mild Delay  Grasp: Bilateral Grasp Functional for Activity    UE Strength / Tone  Overall Strength / Tone: WFL Able to Perform ADL Tasks    Education  Persons Educated: Patient  Barriers To Learning: Cognitive Deficits  Interventions: Repetition of Instructions;Physical Cueing  Teaching Methods: Verbal Instruction  Patient Response: Return Demonstration;More Instruction Required  Topics: Role of OT, Goals for Therapy  Goal Formulation: With Patient    Assessment  Assessment: Decreased ADL Status;Decreased Safe/Judg during ADL;Decreased Endurance;Decreased Self-Care Trans;Decreased High-Level ADLs  Prognosis: Good;w/Cont OT s/p Acute Discharge  Goal Formulation: Patient    AM-PAC 6 Clicks Daily Activity Inpatient  Putting on and taking off regular lower body clothes?: A Little  Bathing (Including washing, rinsing, drying): A Little  Toileting, which includes using toilet, bedpan, or urinal: A Little  Putting on and taking off regular upper body clothing: None  Taking care of personal grooming such as brushing teeth: None  Eating meals?: None  Daily Activity Raw Score: 21  Standardized (t-scale) score: 44.27  CMS 0-100% Score: 32.79  CMS G Code Modifier: CJ    Plan  Progress: Progressing Toward Goals  OT Frequency: 3-5x/week  OT Plan for Next Visit: LE dressing with pants, balance tasks    ADL Goals  Patient Will Perform Grooming: Standing at Sink;w/ Stand By Assist  Patient Will Perform LE Dressing: w/ Stand By Assist    Functional Transfer Goals  Pt Will Perform All Functional Transfers: w/ Stand By Assist    OT Discharge Recommendations

## 2018-10-08 NOTE — Progress Notes
OCCUPATIONAL THERAPY  NOTE       Name: Charles Vaughan            MRN: [id number]                DOB: [birth year]          Age: 63 y.o.  Admission Date: 09/30/2018             LOS: 6 days      Attempted to see patient for OT session. Patient just finishing shower with aid, per report from aid only required stand by assist. Witnessed ambulating with walker and contact guard. Patient with visitors arriving at this time and requesting time to visit. OT will follow up later as time allows.     Therapist: Lemar [place], [place] [id number]  Date: 10/08/2018

## 2018-10-08 NOTE — Progress Notes
74M w/ anxiety disorder, MDD, ETOH and BZD use disorder, CAD s/p DES-LAD (05/2018) prsented to ED w/ 7-10d of atypical, exertional chest pain, suspected secondary to COPD exacerbation and angina in setting of non-compliance to medications despite recent PCI. Admitted for further mgmt.  ???  IMPRESSION  Zones of linear atelectasis or scarring in the left lower lobe.  No acute cardiopulmonary abnormalities.    Handedness: Right  Hearing: WFL  Education Level: masters degree  Lives With: roomates  Receives Help From:roomates  Psychosocial Status: Willing and Cooperative to Participate  Persons Present: None    Subjective*  Pain: Patient has no complaint of pain  Pain Level Current*: No pain  Feeding Tube Present During Eval: None    Education*  Persons Educated: Patient  Barriers To Learning: Family not present/confusion  Interventions: Repetition of Instructions  Teaching Methods: Verbal  Topics: Memory  Patient Response: Verbalized Understanding  Goal Formulation: With Patient      Therapist: Cheryl Flash - M.S. CCC-L/SLP   [id number]  Date: 10/08/2018

## 2018-10-08 NOTE — Progress Notes
Objective:                          Vital Signs: Last Filed                 Vital Signs: 24 Hour Range   BP: 151/69 (02/12 0822)  Temp: 36.3 ???C (97.4 ???F) (02/12 4782)  Pulse: 118 (02/12 0822)  Respirations: 18 PER MINUTE (02/12 0822)  SpO2: 92 % (02/12 0822)  SpO2 Pulse: 109 (02/12 0519) BP: (115-162)/(69-93)   Temp:  [36 ???C (96.8 ???F)-36.8 ???C (98.2 ???F)]   Pulse:  [80-120]   Respirations:  [16 PER MINUTE-18 PER MINUTE]   SpO2:  [92 %-99 %]    Intensity Pain Scale (Self Report): 5 (10/08/18 0810)  Intensity Pain Scale (Self Report): 8 (10/07/18 2014) Vitals:    10/06/18 0500 10/07/18 0400 10/08/18 0519   Weight: 78 kg (171 lb 15.3 oz) 76.8 kg (169 lb 6.4 oz) 76.1 kg (167 lb 12.8 oz)       Intake/Output Summary:  (Last 24 hours)    Intake/Output Summary (Last 24 hours) at 10/08/2018 9562  Last data filed at 10/08/2018 0810  Gross per 24 hour   Intake 1900 ml   Output 2225 ml   Net -325 ml      Stool Occurrence: 1      Physical exam:  General: NAD  Cardiovascular: RRR, no murmurs.   Pulmonary/Chest: Effort normal and breath sounds normal. No respiratory distress. No wheezes or crackles. Tender to palpation  Abdominal: Soft, distended, hyperactive BS, nontender, and no mass  Ext: No pitting edema in extremities. Nml distal pulses  Neurological: A&Ox3, no focal findings  Skin: Warm and dry. No rash noted. No erythema. No pallor.    Lab/Imaging Reviewed  24-hour labs:    Results for orders placed or performed during the hospital encounter of 09/30/18 (from the past 24 hour(s))   MAGNESIUM    Collection Time: 10/08/18  5:57 AM   Result Value Ref Range    Magnesium 2.1 1.6 - 2.6 mg/dL   BASIC METABOLIC PANEL    Collection Time: 10/08/18  5:57 AM   Result Value Ref Range    Sodium 138 137 - 147 MMOL/L    Potassium 4.0 3.5 - 5.1 MMOL/L    Chloride 101 98 - 110 MMOL/L    CO2 26 21 - 30 MMOL/L    Anion Gap 11 3 - 12    Glucose 105 (H) 70 - 100 MG/DL    Blood Urea Nitrogen 14 7 - 25 MG/DL    Creatinine 1.30 0.4 - 1.24 MG/DL

## 2018-10-09 LAB — MAGNESIUM: Lab: 2.1 mg/dL — ABNORMAL LOW (ref >60)

## 2018-10-09 LAB — BASIC METABOLIC PANEL: Lab: 141 MMOL/L — ABNORMAL LOW (ref >60)

## 2018-10-09 LAB — CBC: Lab: 8.8 K/UL — ABNORMAL HIGH (ref 4.5–11.0)

## 2018-10-09 LAB — POC GLUCOSE: Lab: 80 mg/dL (ref 70–100)

## 2018-10-09 NOTE — Progress Notes
PHYSICAL THERAPY  PROGRESS NOTE        Name: Charles Vaughan            MRN: [id number]                DOB: [birth year]          Age: 63 y.o.  Admission Date: 09/30/2018             LOS: 7 days        Mobility  Patient Turn/Position: Self  Progressive Mobility Level: Walk in hallway  Distance Walked (feet): 180 ft  Level of Assistance: Assist X1  Assistive Device: Walker  Time Tolerated: 11-30 minutes  Activity Limited By: Dizziness;Fatigue;Weakness;Patient request to stop;Shortness of air    Subjective  Significant hospital events: PMH: COPD, ETOH and BZD abuse, and possible underlying psychiatric disorder.  Pt admitted with atypical chest pain, presenting with confusion/encephalopathy and currently AWAS.  Mental / Cognitive Status: Alert;Disoriented;Cooperative;Follows Commands;Anxious  Persons Present: (telesitter)  Pain: Patient has no complaint of pain  Ambulation Assist: Independent Mobility in [employer] without Device  Home Situation: Lives wth Roommate  Type of Home: House  Entry Stairs: 3-5 Stairs  In-Home Stairs: Able to Live on One Level  Comments: Patient states being unsure of mobility this date due to feeling slightly disoriented, however agrees that he needs to move.    Bed Mobility/Transfer  Comments: Patient in chair upon arrival.  Transfer Type: Sit to/from Stand  Transfer: Assistance Level: To/From;Bed Side Chair;Standby Assist  Transfer: Assistive Device: Nurse, adult  Transfers: Type Of Assistance: Materials engineer;For Balance;For Safety Considerations  End Of Activity Status: In Bed;Nursing Notified;Instructed Patient to Request Assist with Mobility;Instructed Patient to Use Call Light  Comments: Patient transferred by wheelchair to bed at end of session; patient was very anxious after loss of balance in hallway requiring session to be ended.    Balance   Patient dynamic standing balance limited by mental status variability; easily distracted during gait causing unsteadiness and loss of balance. Moving to and from a bed to a chair (including a wheelchair): A Little  Standing up from a chair using your arms (e.g. wheelchair, or bedside chair): A Little  To walk in hospital room: A Little  Climbing 3-5 steps with a railing: A Lot  Raw Score: 18  Standardized (T-scale) Score: 41.05  Basic Mobility CMS 0-100%: 40.47  CMS G Code Modifier for Basic Mobility: CK    Goals  Goal Formulation: With Patient  Time For Goal Achievement: 5 days  Patient Will Go Supine To/From Sit: Independently  Patient Will Transfer Sit to Stand: New Goal, w/ Stand By Assist  Patient Will Ambulate: Greater than 200 Feet, w/ No Device  Patient Will Go Up / Down Stairs: 3-5 Stairs, w/ Stand By Assist    Plan  Treatment Interventions: Mobility Training;Balance Activities;Endurance Training  Plan Frequency: 5 Days per Week  PT Plan for Next Visit: Increase gait distance with chair follow in order to decrease anxiety and fear with ambulation. Stair training if mental status allows.    PT Discharge Recommendations  Recommendation: Inpatient setting  Patient Currently Requires Physical Assist With: Transfers;Ambulation;Stairs  Patient Currently Requires Supervision For: Mobility  Patient Currently Requires Equipment: Walker with wheels    Therapist: Ofilia Neas  Date: 10/09/2018

## 2018-10-10 LAB — MAGNESIUM: Lab: 2.2 mg/dL — ABNORMAL LOW (ref >60)

## 2018-10-10 LAB — CBC: Lab: 8.6 10*3/uL — ABNORMAL HIGH (ref 4.5–11.0)

## 2018-10-10 LAB — BASIC METABOLIC PANEL: Lab: 141 MMOL/L — ABNORMAL LOW (ref 137–147)

## 2018-10-10 MED ORDER — ACETAMINOPHEN/LIDOCAINE/ANTACID DS(#) 1:1:3  PO SUSP
30 mL | Freq: Once | ORAL | 0 refills | Status: CP
Start: 2018-10-10 — End: ?
  Administered 2018-10-10: 17:00:00 30 mL via ORAL

## 2018-10-10 NOTE — Progress Notes
________________________________________________________________________    Subjective  No acute events o/n. V/s and labs remain stable. Is feeling really well today. Is up and walking aroudn w/ pt. Had some abd/chest pain this mroning; improved significantly w/ GI cocktail. No other concerns today, otherwise.     Medications  Scheduled Meds:albuterol 0.5% (PROVENTIL) nebulizer solution 2.5 mg, 2.5 mg, Inhalation, Q6H While Awake & PRN    And  ipratropium bromide (ATROVENT) 0.02 % nebulizer solution 0.5 mg, 0.5 mg, Inhalation, Q6H While Awake & PRN  aspirin chewable tablet 81 mg, 81 mg, Oral, QDAY  atorvastatin (LIPITOR) tablet 40 mg, 40 mg, Oral, QDAY  calcium carbonate (TUMS) chew tablet 500 mg, 500 mg, Oral, QID w/meals  clopiDOGrel (PLAVIX) tablet 75 mg, 75 mg, Oral, QDAY  diphenhydrAMINE/lidocaine/antacid (#) (MAGIC MOUTHWASH) 1:1:1 suspension 10 mL, 10 mL, Swish & Swallow, TID before meals  enoxaparin (LOVENOX) syringe 40 mg, 40 mg, Subcutaneous, QDAY(21)  fluoxetine (PROZAC) capsule 40 mg, 40 mg, Oral, QDAY  folic acid (FOLVITE) tablet 1 mg, 1 mg, Oral, QDAY  gabapentin (NEURONTIN) capsule 600 mg, 600 mg, Oral, TID  pantoprazole DR (PROTONIX) tablet 40 mg, 40 mg, Oral, QDAY(21)  polyethylene glycol 3350 (MIRALAX) packet 17 g, 1 packet, Oral, QDAY  QUEtiapine (SEROQUEL) tablet 200 mg, 200 mg, Oral, QHS  senna/docusate (SENOKOT-S) tablet 1 tablet, 1 tablet, Oral, BID  thiamine mononitrate tablet 100 mg, 100 mg, Oral, QDAY  vitamins, B complex tablet 1 tablet, 1 tablet, Oral, QDAY  vitamins, multiple tablet 1 tablet, 1 tablet, Oral, QDAY    Continuous Infusions:  PRN and Respiratory Meds:acetaminophen Q6H PRN, albuterol sulfate Q4H PRN, aluminum/magnesium hydroxide TID w/ meals PRN, benztropine QHS PRN, haloperidol Q4H PRN **OR** haloperidol Q4H PRN, hydrOXYzine Q6H PRN, ibuprofen Q6H PRN, melatonin QHS PRN, nitroglycerin Q5 MIN PRN, ondansetron Q6H PRN **OR** ondansetron (ZOFRAN) IV Q6H PRN, polyethylene glycol 3350 QDAY PRN, simethicone Q6H PRN        Objective:                          Vital Signs: Last Filed                 Vital Signs: 24 Hour Range   BP: 120/83 (02/14 0858)  Temp: 36.9 ???C (98.5 ???F) (02/14 4540)  Pulse: 89 (02/14 0858)  Respirations: 18 PER MINUTE (02/14 0858)  SpO2: 96 % (02/14 0858)  SpO2 Pulse: 93 (02/14 0443) BP: (118-160)/(62-83)   Temp:  [36.3 ???C (97.4 ???F)-36.9 ???C (98.5 ???F)]   Pulse:  [89-107]   Respirations:  [18 PER MINUTE]   SpO2:  [92 %-96 %]      Vitals:    10/07/18 0400 10/08/18 0519 10/09/18 0410   Weight: 76.8 kg (169 lb 6.4 oz) 76.1 kg (167 lb 12.8 oz) 82.2 kg (181 lb 3.5 oz)       Intake/Output Summary:  (Last 24 hours)    Intake/Output Summary (Last 24 hours) at 10/10/2018 9811  Last data filed at 10/09/2018 2157  Gross per 24 hour   Intake 965 ml   Output 1840 ml   Net -875 ml      Stool Occurrence: 1      Physical exam:  General: NAD  Cardiovascular: RRR, no murmurs.   Pulmonary/Chest: Effort normal and breath sounds normal. No respiratory distress. No wheezes or crackles. Tender to palpation  Abdominal: Soft, distended, hyperactive BS, nontender, and no mass  Ext: No pitting edema in extremities. Nml distal pulses  Neurological: A&Ox3, no focal findings  Skin: Warm and dry. No rash noted. No erythema. No pallor.    Lab/Imaging Reviewed  24-hour labs:    Results for orders placed or performed during the hospital encounter of 09/30/18 (from the past 24 hour(s))   BASIC METABOLIC PANEL    Collection Time: 10/10/18  5:38 AM   Result Value Ref Range    Sodium 141 137 - 147 MMOL/L    Potassium 4.5 3.5 - 5.1 MMOL/L    Chloride 105 98 - 110 MMOL/L    CO2 26 21 - 30 MMOL/L    Anion Gap 10 3 - 12    Glucose 85 70 - 100 MG/DL    Blood Urea Nitrogen 14 7 - 25 MG/DL    Creatinine 1.61 0.4 - 1.24 MG/DL    Calcium 9.1 8.5 - 09.6 MG/DL    eGFR Non African American >60 >60 mL/min    eGFR African American >60 >60 mL/min   CBC    Collection Time: 10/10/18  5:38 AM   Result Value Ref Range

## 2018-10-10 NOTE — Progress Notes
RT Adult Assessment Note    NAME:Charles Vaughan             MRN: [id number]             DOB:02-23-1956          AGE: 63 y.o.  ADMISSION DATE: 09/30/2018             DAYS ADMITTED: LOS: 7 days    RT Treatment Plan:  Protocol Plan: Medications  Albuterol: MDI PRN  Albuterol/Ipratropium: Neb TID;Neb PRN    Protocol Plan: Procedures  Vibrating PEP Therapy: Q6h While Awake  PAP: Place a nursing order for IS Q1h While Awake for any of Lung Expansion indicators  Oxygen/Humidity: Discontinued  Monitoring: Discontinued    Additional Comments:  Impressions of the patient: pt. lying in bed; no immediate respiratory needs identified at this time outside of current regimen  Intervention(s)/outcome(s): see RT Treatment Plan above  Patient education that was completed: none at this time  Recommendations to the care team: have PFTs perform to rule out/confirm COPD diagnosis listed in chart.    Vital Signs:  Pulse: 100  RR: 18 PER MINUTE  SpO2: 94 %  O2 Device:    Liter Flow:    O2%: 21 %  Breath Sounds:    Respiratory Effort: Non-Labored

## 2018-10-10 NOTE — Progress Notes
turns, however patient relying heavily on UE use for balance.  Activity Limited By: Complaint of Fatigue    Education  Persons Educated: Patient  Patient Barriers To Learning: None Noted  Interventions: Repetition of Instructions;Demonstration Provided  Teaching Methods: Verbal Instruction;Demonstration  Patient Response: Verbalized Understanding;Return Demonstration;More Instruction Required  Topics: Plan/Goals of PT Interventions;Use of Assistive Device/Orthosis;Mobility Progression;Safety Awareness;Up with Assist Only;Importance of Increasing Activity;Recommend Continued Therapy;Therapy Schedule  Comments: Reviewed sit to stand transfer with patient to assess carryover; required more training to ensure safety and decrease chances for loss of balance.    Assessment/Progress  Impaired Mobility Due To: Impaired Balance;Cognitive Deficits;Safety Concerns;Decreased Activity Tolerance;Deconditioning  Impaired Strength Due To: Cognitive Deficits;Decreased Activity Tolerance;Deconditioning  Assessment/Progress: Should Improve w/ Continued PT    AM-PAC 6 Clicks Basic Mobility Inpatient  Turning from your back to your side while in a flat bed without using bed rails: None  Moving from lying on your back to sitting on the side of a flatbed without using bedrails : A Little  Moving to and from a bed to a chair (including a wheelchair): A Little  Standing up from a chair using your arms (e.g. wheelchair, or bedside chair): A Little  To walk in hospital room: A Little  Climbing 3-5 steps with a railing: A Little  Raw Score: 19  Standardized (T-scale) Score: 42.48  Basic Mobility CMS 0-100%: 36.99  CMS G Code Modifier for Basic Mobility: CJ    Goals  Goal Formulation: With Patient  Time For Goal Achievement: 5 days  Patient Will Go Supine To/From Sit: Independently  Patient Will Transfer Sit to Stand: w/ Stand By Assist, Met  Patient Will Ambulate: Greater than 200 Feet, w/ No Device

## 2018-10-10 NOTE — Progress Notes
10/09/18 2047   Assessment / Education   Delayed Therapy Patient Unavailable  (unable to see patient for scheduled Neb-with RN)

## 2018-10-11 LAB — TROPONIN-I: Lab: 0 ng/mL (ref 0.0–0.05)

## 2018-10-11 LAB — CBC: Lab: 9.2 K/UL — ABNORMAL LOW (ref 4.5–11.0)

## 2018-10-11 LAB — MAGNESIUM: Lab: 2 mg/dL — ABNORMAL LOW (ref >60)

## 2018-10-11 LAB — BASIC METABOLIC PANEL: Lab: 138 MMOL/L — ABNORMAL LOW (ref 137–147)

## 2018-10-11 MED ORDER — ACETAMINOPHEN/LIDOCAINE/ANTACID DS(#) 1:1:3  PO SUSP
30 mL | ORAL | 0 refills | Status: DC | PRN
Start: 2018-10-11 — End: 2018-10-15
  Administered 2018-10-11 – 2018-10-12 (×2): 30 mL via ORAL

## 2018-10-11 MED ORDER — SODIUM CHLORIDE 0.65 % NA SPRA
1-2 | NASAL | 0 refills | Status: DC | PRN
Start: 2018-10-11 — End: 2018-10-15

## 2018-10-11 NOTE — Progress Notes
Notified Med Private T via text page of ongoing intermittent chest pain. 650 mg Tylenol given. Troponin added to morning labs. Will continue to monitor.

## 2018-10-12 MED ORDER — HYDROXYZINE HCL 25 MG PO TAB
25 mg | ORAL | 0 refills | Status: DC | PRN
Start: 2018-10-12 — End: 2018-10-13
  Administered 2018-10-12 – 2018-10-13 (×8): 25 mg via ORAL

## 2018-10-12 NOTE — Progress Notes
LABS:     Recent Labs     10/10/18  0538 10/11/18  0517   NA 141 138   K 4.5 3.9   CL 105 103   CO2 26 25   GAP 10 10   BUN 14 13   CR 1.06 0.96   GLU 85 90   CA 9.1 9.1   MG 2.2 2.0       Recent Labs     10/10/18  0538 10/11/18  0517   WBC 8.6 9.2   HGB 12.3* 12.2*   HCT 35.9* 36.1*   PLTCT 568* 568*   TNI  --  0.00     Endocrine:   Lab Results   Component Value Date    HGBA1C 5.7 09/30/2018    TSH 1.20 10/06/2018        EKG/RADIOLOGY  Pertinent results reviewed.

## 2018-10-13 LAB — MAGNESIUM: Lab: 2 mg/dL — ABNORMAL LOW (ref 1.6–2.6)

## 2018-10-13 LAB — CBC: Lab: 7.6 K/UL — ABNORMAL HIGH (ref 4.5–11.0)

## 2018-10-13 LAB — BASIC METABOLIC PANEL: Lab: 139 MMOL/L — ABNORMAL HIGH (ref 137–147)

## 2018-10-13 MED ORDER — HYDROXYZINE HCL 25 MG PO TAB
50 mg | Freq: Four times a day (QID) | ORAL | 0 refills | Status: DC | PRN
Start: 2018-10-13 — End: 2018-10-14
  Administered 2018-10-13 – 2018-10-14 (×3): 50 mg via ORAL

## 2018-10-13 NOTE — Progress Notes
-   continue Plavix 75 mg daily to complete 1 year  - Continue aspirin 81 mg daily and atorvastatin 40 mg daily???  ???  #Dysphagia  - high risk of aspiration- due to severe encephalopathy   - Failed swallow study on 10/03/2018. Repeated again 02/10  - S/P VIDEO SWALLOW ABNML  Plan:  - switched to reg diet per speech recs, SLP following  ???  #Abdominal pain  - ?related to gas, heartburn, constipation- given abd distension, had KUB on 02/11. No obstruction noted  Plan:  - continue PPI, simethicone, maalox, tums  -GI cocktail prn  - continue bowel regimen to prevent constipation  ???  #COPD exacerbation, mild- resolved  -completed 5 days of prednisone (2/10)  Plan:  -???Acapella TID and PRN, IS  - Continue duoneb???q6 hour    Dvt Ppx: lovenox  Diet: Regular diet  Code Status: Full code  Dispo: Plan for D/C to snf when placement is procured. Pt had initially refused placement, and there was concern about his capacity to make this decision; pt seen by psych (after his acute etoh w/drawal resolved). However, by the time pt was evaluated by psych, he had changed his mind and is now agreeable to placement in SNF    Andrez Grime    Pager # [phone]  10/13/2018  ______________________________________________________________________________  Subjective:  Patient resting this morning. States if he takes his hydroxyzine that he is able to eat without much difficulty. No fever or chills. Reports vague chest pain but is better when his anxiety is not so high. No shortness of breath. No fever. He reports chills. He is wondering if he can take something else for anxiety. I discussed that I would touch base with psychiatry.     MEDS / ALLERGIES          MEDSalbuterol 0.5%, 2.5 mg, Inhalation, Q6H While Awake & PRN    And  ipratropium bromide, 0.5 mg, Inhalation, Q6H While Awake & PRN  aspirin, 81 mg, Oral, QDAY  atorvastatin, 40 mg, Oral, QDAY  calcium carbonate, 500 mg, Oral, QID w/meals  clopiDOGrel, 75 mg, Oral, QDAY

## 2018-10-13 NOTE — Progress Notes
Psychiatric Consultation Progress Note    LOS: 11 days    Current Psychotropic Meds:   - Fluoxetine 40mg  QDAY  - Hydroxyzine 25mg  Q3hours PRN  - Haldol 2mg  Q4H PRN  - Gabapentin 600mg  TID  - Seroquel 200mg  QHS  - Cogentin 1mg  QHS PRN    Psychiatric Assessment:  1. Acute encephalopathy - resolved, secondary to alcohol and benzodiazepine withdrawal  2. MDD, recurrent, moderate  3. Unspecified Anxiety Disorder  4. Benzodiazepine Use Disorder, moderate - severe    Recommendations:  ??? Increase Hydroxyzine to 50mg  QID PRN for acute anxiety   ??? Continue prozac 40mg  qday for mood and anxiety (increased on 2/5)  ??? No other psychotropic recommendation changes at this time.   ??? Pt is understanding of plan for SNF for disposition at current. Pt has capacity to sign into SNF.   ??? Ongoing work from CM/SW for placement.     #MOCA: 12/30 on 10/08/18  ???  #Per SLP recommendations:  Recommend assist w/ finance & medication management and meal preparation upon discharge secondary to patient's???current cognitive status. Assistance for safety awareness will also likely be required.   ???  #Per PT recommendations:   Pt requires physical assistance with transfers, ambulation, stairs, requires supervision for mobility and requires a walker with wheels.       Seen and discussed with: Dr. Fara Boros    Please feel free to contact us with any additional questions or concerns by paging the consult team between 8am and 5pm on weekdays and between 8am and 3pm on weekends at [phone]. Otherwise, page the psychiatry resident on call.    --------------------------------------------------------------------------------------------------------------------------------  Subjective:  Charles Vaughan was seen for follow up today. Pt states having chest pains which worsen when he is feeling anxious. He notes hydroxyzine has been helpful for him in the hospital as well. He notes good mood today

## 2018-10-13 NOTE — Care Plan
Pt reporting anxiety, meds given, pt walked on unit, practiced deep breathing. MPT aware.

## 2018-10-14 MED ORDER — HYDROXYZINE HCL 25 MG PO TAB
25 mg | ORAL | 0 refills | Status: DC | PRN
Start: 2018-10-14 — End: 2018-10-15
  Administered 2018-10-14 – 2018-10-15 (×6): 25 mg via ORAL

## 2018-10-14 NOTE — Progress Notes
Progress Note    Charles Vaughan  Date of Admission: 09/30/2018    ASSESSMENT AND PLAN:   Principal Problem:    Encephalopathy acute  Active Problems:    Chest pain    Hallucination    COPD exacerbation (HCC)    Non compliance w medication regimen    45M w/ anxiety disorder, MDD, ETOH and BZD use disorder, CAD s/p DES-LAD (05/2018) prsented to ED w/ 7-10d of atypical, exertional chest pain, suspected secondary to COPD exacerbation and angina in setting of non-compliance to medications despite recent PCI. Admitted for further mgmt.    Interval Changes:  -Continue to work toward SNF placement  Change hydroxyzine to 25 mg every 3 hours as needed per patient request  -Patient deemed to have capacity via psych evaluation on 2/17.     #Anxiety  -Previously on benzodiazepines with significant concerns for abuse  Plan:  > Prozac 40 mg daily  > Hydroxyzine 25 mg every 3 hours as needed  > No benzodiazepine use given abuse history and concerns    Acute metabolic / toxic encephalopathy with hallucinations, RESOLVED  ETOH and BZD use disorder   - Likely secondary to BZD / alcohol withdrawal; however, daughter concerned about patient underlying cognitive status- thinks it has been declining independent of his etoh use  - Actively hallucinating on 2/7; now improved. Pt doesn't seem to be going through withdrawal anymore, but he continues to have cognitive deficits and poor insight  -Completed ativan taper  Plan:  -Gabapentin 600 mg TID  -No benzodiazpine usage given concern for abuse  - Haldol PRN available; Continue PTA Seroquel 200 mg QHS; Prozac increased to 40 m daily   - Continue folic acid, thiamine, multivitamin and vitamin B complex???  ???  #Chest pain, atypical for acute coronary syndrome, INTERMITTENT  - pt does have CAD w/ recent DES to LAD (05/2018); however, repeat troponins and ECG during hospital stay (09/2018) without evidence of acute ischemia  - cards was consulted; initially pt was loaded w/ plavix. CVS: Regular rate and rhythm, S1+ S2+ normal, no murmur/rub  ABD: Soft, non-tender, non-distended, bowel sounds +  EXT: No edema/cyanosis  CNS: No focal deficits, moving all extremities without focal deficit  Psych: anxious    LABS:     Recent Labs     10/13/18  0531   NA 139   K 4.2   CL 103   CO2 27   GAP 9   BUN 13   CR 0.93   GLU 88   CA 9.3   MG 2.0       Recent Labs     10/13/18  0531   WBC 7.6   HGB 12.2*   HCT 35.1*   PLTCT 562*     Endocrine:   Lab Results   Component Value Date    HGBA1C 5.7 09/30/2018    TSH 1.20 10/06/2018        EKG/RADIOLOGY  Pertinent results reviewed.

## 2018-10-14 NOTE — Progress Notes
Climbing 3-5 steps with a railing: A Little  Raw Score: 19  Standardized (T-scale) Score: 42.48  Basic Mobility CMS 0-100%: 36.99  CMS G Code Modifier for Basic Mobility: CJ    Goals  Goal Formulation: With Patient  Time For Goal Achievement: 5 days  Patient Will Go Supine To/From Sit: Independently  Patient Will Transfer Sit to Stand: w/ Stand By Assist, Met  Patient Will Ambulate: Greater than 200 Feet, w/ No Device  Patient Will Go Up / Down Stairs: 3-5 Stairs, w/ Stand By Assist    Plan  Treatment Interventions: Mobility Training;Balance Activities;Endurance Training  Plan Frequency: 5 Days per Week  PT Plan for Next Visit: Progress gait distance. Assess bed mobility/transfers. Stair training if appropriate.    PT Discharge Recommendations  Recommendation: Inpatient setting  Patient Currently Requires Physical Assist With: Transfers;Ambulation;Stairs  Patient Currently Requires Supervision For: Mobility  Patient Currently Requires Equipment: Walker with wheels    Therapist: Ofilia Neas  Date: 10/14/2018
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
## 2018-10-14 NOTE — Progress Notes
OCCUPATIONAL THERAPY  PROGRESS NOTE      Name: Charles Vaughan            MRN: [id number]                DOB: 1955-09-08          Age: 63 y.o.  Admission Date: 09/30/2018             LOS: 12 days      Mobility  Patient Turn/Position: Chair  Progressive Mobility Level: Walk in hallway  Distance Walked (feet): 100 ft(+100)  Level of Assistance: Assist X1  Assistive Device: Walker  Time Tolerated: 11-30 minutes  Activity Limited By: Fatigue;Weakness    Subjective  Pertinent Dx per Physician: history of anxiety disorder & schizophrenia, ETOH and BZD use disorder, CAD s/p PCI in 05/2018 who presented to the ED with Chest Pain  Precautions: Standard;Falls(Nectar thick liquids)  Pain / Complaints: Patient agrees to participate in therapy;Patient has no c/o pain    Objective  Psychosocial Status: Willing and Cooperative to Participate  Persons Present: Nursing Staff    Home Living  Type of Home: House  Home Layout: One Level(4 step entry)  Financial risk analyst / Tub: Tub Only  Home Equipment: (none)    Prior Function  Level Of Independence: Independent with ADLs and functional transfers  Lives With: Friend(s)    ADL's  Where Assessed: Standing at [employer] Assist: Minimal Assist  Grooming Deficits: Setup;Standing With Assistive Device;Steadying  Functional Transfer Assist: Minimal Assist  Functional Transfer Deficits: Setup;Steadying;Verbal Cueing;Supervision/Safety;Increased Time to Complete  Comment: Supine to EOB standby assist. Pt sitting EOB 3+ minutes with report of dizziness however disipates with continued sitting. Sit to stand with ambulation to sink minimal assist with walker. Completes ADLs standing at sink with minimal assist to steady at times. Pt ambulates on unit with one seated rest breal required. Pt with BLE weakness and slight buckling at times. Pt requires chair follow for safety returning to room. Pt returns to room and remains sitting in recliner with all needs met and RN updated.     Activity Tolerance

## 2018-10-15 ENCOUNTER — Emergency Department: Admit: 2018-09-30 | Discharge: 2018-09-30 | Payer: 59

## 2018-10-15 ENCOUNTER — Inpatient Hospital Stay: Admit: 2018-10-02 | Discharge: 2018-10-02 | Payer: 59

## 2018-10-15 ENCOUNTER — Encounter: Admit: 2018-10-15 | Discharge: 2018-10-15 | Payer: 59

## 2018-10-15 ENCOUNTER — Inpatient Hospital Stay: Admit: 2018-10-05 | Discharge: 2018-10-05 | Payer: 59

## 2018-10-15 DIAGNOSIS — G92 Toxic encephalopathy: ICD-10-CM

## 2018-10-15 DIAGNOSIS — J441 Chronic obstructive pulmonary disease with (acute) exacerbation: Principal | ICD-10-CM

## 2018-10-15 DIAGNOSIS — I25119 Atherosclerotic heart disease of native coronary artery with unspecified angina pectoris: ICD-10-CM

## 2018-10-15 DIAGNOSIS — F1320 Sedative, hypnotic or anxiolytic dependence, uncomplicated: ICD-10-CM

## 2018-10-15 DIAGNOSIS — R1013 Epigastric pain: ICD-10-CM

## 2018-10-15 DIAGNOSIS — Z87891 Personal history of nicotine dependence: ICD-10-CM

## 2018-10-15 DIAGNOSIS — R1310 Dysphagia, unspecified: ICD-10-CM

## 2018-10-15 DIAGNOSIS — Z7982 Long term (current) use of aspirin: ICD-10-CM

## 2018-10-15 DIAGNOSIS — R079 Chest pain, unspecified: Secondary | ICD-10-CM

## 2018-10-15 DIAGNOSIS — F10239 Alcohol dependence with withdrawal, unspecified: ICD-10-CM

## 2018-10-15 DIAGNOSIS — Z79899 Other long term (current) drug therapy: ICD-10-CM

## 2018-10-15 DIAGNOSIS — F0150 Vascular dementia without behavioral disturbance: ICD-10-CM

## 2018-10-15 DIAGNOSIS — F419 Anxiety disorder, unspecified: ICD-10-CM

## 2018-10-15 DIAGNOSIS — F13239 Sedative, hypnotic or anxiolytic dependence with withdrawal, unspecified: ICD-10-CM

## 2018-10-15 DIAGNOSIS — F331 Major depressive disorder, recurrent, moderate: ICD-10-CM

## 2018-10-15 DIAGNOSIS — Z955 Presence of coronary angioplasty implant and graft: ICD-10-CM

## 2018-10-15 DIAGNOSIS — E785 Hyperlipidemia, unspecified: ICD-10-CM

## 2018-10-15 DIAGNOSIS — I10 Essential (primary) hypertension: ICD-10-CM

## 2018-10-15 DIAGNOSIS — R911 Solitary pulmonary nodule: ICD-10-CM

## 2018-10-15 DIAGNOSIS — Z9114 Patient's other noncompliance with medication regimen: ICD-10-CM

## 2018-10-15 DIAGNOSIS — Z7902 Long term (current) use of antithrombotics/antiplatelets: ICD-10-CM

## 2018-10-15 LAB — MAGNESIUM: Lab: 2 mg/dL — ABNORMAL LOW (ref >60)

## 2018-10-15 MED ORDER — CALCIUM CARBONATE 200 MG CALCIUM (500 MG) PO CHEW
500 mg | ORAL_TABLET | ORAL | 0 refills | Status: SS | PRN
Start: 2018-10-15 — End: 2019-02-19

## 2018-10-15 MED ORDER — SODIUM CHLORIDE 0.65 % NA SPRA
1-2 | NASAL | 0 refills | Status: SS | PRN
Start: 2018-10-15 — End: 2019-02-19

## 2018-10-15 MED ORDER — ALBUTEROL SULFATE 2.5 MG/0.5 ML IN NEBU
2.5 mg | RESPIRATORY_TRACT | 1 refills | Status: AC | PRN
Start: 2018-10-15 — End: 2019-06-11

## 2018-10-15 MED ORDER — METOPROLOL TARTRATE 25 MG PO TAB
12.5 mg | ORAL_TABLET | Freq: Every day | ORAL | 3 refills | Status: SS
Start: 2018-10-15 — End: 2019-02-18

## 2018-10-15 MED ORDER — QUETIAPINE 200 MG PO TAB
200 mg | ORAL_TABLET | Freq: Every evening | ORAL | 3 refills | Status: AC
Start: 2018-10-15 — End: 2019-05-11

## 2018-10-15 MED ORDER — HYDROXYZINE HCL 25 MG PO TAB
25 mg | ORAL_TABLET | ORAL | 1 refills | 30.00000 days | Status: AC | PRN
Start: 2018-10-15 — End: 2019-05-11

## 2018-10-15 MED ORDER — BENZTROPINE 1 MG PO TAB
1 mg | ORAL_TABLET | Freq: Every evening | ORAL | 3 refills | Status: AC | PRN
Start: 2018-10-15 — End: 2019-05-11

## 2018-10-15 MED ORDER — ACETAMINOPHEN/LIDOCAINE/ANTACID DS(#) 1:1:3  PO SUSP
30 mL | Freq: Two times a day (BID) | ORAL | 1 refills | Status: SS | PRN
Start: 2018-10-15 — End: 2019-02-09

## 2018-10-15 MED ORDER — DICYCLOMINE 10 MG PO CAP
10 mg | Freq: Before meals | ORAL | 0 refills | Status: DC | PRN
Start: 2018-10-15 — End: 2018-10-15
  Administered 2018-10-15: 17:00:00 10 mg via ORAL

## 2018-10-15 MED ORDER — CLOPIDOGREL 75 MG PO TAB
75 mg | ORAL_TABLET | Freq: Every day | ORAL | 3 refills | 90.00000 days | Status: AC
Start: 2018-10-15 — End: 2019-05-11

## 2018-10-15 MED ORDER — FLUOXETINE 40 MG PO CAP
40 mg | ORAL_CAPSULE | Freq: Every day | ORAL | 3 refills | Status: SS
Start: 2018-10-15 — End: 2019-02-09

## 2018-10-15 MED ORDER — ATORVASTATIN 40 MG PO TAB
40 mg | ORAL_TABLET | Freq: Every day | ORAL | 3 refills | Status: AC
Start: 2018-10-15 — End: ?

## 2018-10-15 MED ORDER — DICYCLOMINE 10 MG PO CAP
10 mg | ORAL_CAPSULE | Freq: Before meals | ORAL | 0 refills | Status: SS | PRN
Start: 2018-10-15 — End: 2019-02-19

## 2018-10-15 MED ORDER — ASPIRIN 81 MG PO CHEW
81 mg | ORAL_TABLET | Freq: Every day | ORAL | 3 refills | Status: AC
Start: 2018-10-15 — End: 2019-05-11

## 2018-10-15 MED ORDER — POLYETHYLENE GLYCOL 3350 17 GRAM PO PWPK
17 g | Freq: Every day | ORAL | 1 refills | 18.00000 days | Status: AC
Start: 2018-10-15 — End: 2019-05-11

## 2018-10-15 MED ORDER — THIAMINE MONONITRATE (VIT B1) 100 MG PO TAB
100 mg | ORAL_TABLET | Freq: Every day | ORAL | 0 refills | Status: SS
Start: 2018-10-15 — End: 2019-02-09

## 2018-10-23 ENCOUNTER — Encounter: Admit: 2018-10-23 | Discharge: 2018-10-23 | Payer: 59

## 2018-12-09 ENCOUNTER — Encounter: Admit: 2018-12-09 | Discharge: 2018-12-09 | Payer: 59

## 2019-02-06 DIAGNOSIS — Z5321 Procedure and treatment not carried out due to patient leaving prior to being seen by health care provider: Secondary | ICD-10-CM

## 2019-02-07 ENCOUNTER — Encounter: Admit: 2019-02-07 | Discharge: 2019-02-07

## 2019-02-07 ENCOUNTER — Emergency Department: Admit: 2019-02-07 | Discharge: 2019-02-07 | Discharge status: Against Medical Advice

## 2019-02-07 ENCOUNTER — Ambulatory Visit: Admit: 2019-02-07 | Discharge: 2019-02-19 | Disposition: A | Discharge status: Home / Self Care

## 2019-02-07 ENCOUNTER — Emergency Department: Admit: 2019-02-07 | Discharge: 2019-02-07

## 2019-02-07 DIAGNOSIS — Z5320 Procedure and treatment not carried out because of patient's decision for unspecified reasons: Secondary | ICD-10-CM

## 2019-02-07 DIAGNOSIS — E785 Hyperlipidemia, unspecified: Secondary | ICD-10-CM

## 2019-02-07 DIAGNOSIS — I2510 Atherosclerotic heart disease of native coronary artery without angina pectoris: Secondary | ICD-10-CM

## 2019-02-07 LAB — POC TROPONIN: Lab: 0 ng/mL (ref 0.00–0.05)

## 2019-02-07 LAB — POC GLUCOSE: Lab: 108 mg/dL — ABNORMAL HIGH (ref 70–100)

## 2019-02-07 MED ORDER — IPRATROPIUM BROMIDE 17 MCG/ACTUATION IN HFAA
2 | RESPIRATORY_TRACT | 0 refills | Status: DC | PRN
Start: 2019-02-07 — End: 2019-02-09

## 2019-02-07 MED ORDER — ONDANSETRON HCL (PF) 4 MG/2 ML IJ SOLN
4 mg | Freq: Once | INTRAVENOUS | 0 refills | Status: CP
Start: 2019-02-07 — End: ?
  Administered 2019-02-07: 09:00:00 4 mg via INTRAVENOUS

## 2019-02-07 MED ORDER — SODIUM CHLORIDE 0.9 % IV SOLP
2000 mL | INTRAVENOUS | 0 refills | Status: DC
Start: 2019-02-07 — End: 2019-02-09
  Administered 2019-02-07: 23:00:00 2000 mL via INTRAVENOUS

## 2019-02-07 MED ORDER — SODIUM CHLORIDE 0.9 % IJ SOLN
50 mL | Freq: Once | INTRAVENOUS | 0 refills | Status: CP
Start: 2019-02-07 — End: ?
  Administered 2019-02-07: 08:00:00 50 mL via INTRAVENOUS

## 2019-02-07 MED ORDER — ACETAMINOPHEN/LIDOCAINE/ANTACID DS(#) 1:1:3  PO SUSP
30 mL | Freq: Once | ORAL | 0 refills | Status: CP
Start: 2019-02-07 — End: ?
  Administered 2019-02-07: 11:00:00 30 mL via ORAL

## 2019-02-07 MED ORDER — LORAZEPAM 2 MG/ML IJ SOLN
1 mg | Freq: Once | INTRAVENOUS | 0 refills | Status: DC
Start: 2019-02-07 — End: 2019-02-07

## 2019-02-07 MED ORDER — IOHEXOL 350 MG IODINE/ML IV SOLN
80 mL | Freq: Once | INTRAVENOUS | 0 refills | Status: CP
Start: 2019-02-07 — End: ?
  Administered 2019-02-07: 08:00:00 80 mL via INTRAVENOUS

## 2019-02-07 MED ORDER — METOPROLOL TARTRATE 25 MG PO TAB
12.5 mg | Freq: Every day | ORAL | 0 refills | Status: DC
Start: 2019-02-07 — End: 2019-02-10
  Administered 2019-02-07 – 2019-02-10 (×4): 12.5 mg via ORAL

## 2019-02-07 MED ORDER — SODIUM CHLORIDE 0.9 % IV SOLP
1000 mL | Freq: Once | INTRAVENOUS | 0 refills | Status: CP
Start: 2019-02-07 — End: ?
  Administered 2019-02-07: 10:00:00 1000 mL via INTRAVENOUS

## 2019-02-07 MED ORDER — QUETIAPINE 200 MG PO TAB
200 mg | Freq: Every evening | ORAL | 0 refills | Status: DC
Start: 2019-02-07 — End: 2019-02-19
  Administered 2019-02-08 – 2019-02-19 (×12): 200 mg via ORAL

## 2019-02-07 MED ORDER — ACETAMINOPHEN 325 MG PO TAB
650 mg | ORAL | 0 refills | Status: DC | PRN
Start: 2019-02-07 — End: 2019-02-19
  Administered 2019-02-10 – 2019-02-19 (×18): 650 mg via ORAL

## 2019-02-07 MED ORDER — PANTOPRAZOLE 40 MG PO TBEC
40 mg | Freq: Every day | ORAL | 0 refills | Status: DC
Start: 2019-02-07 — End: 2019-02-19
  Administered 2019-02-07 – 2019-02-19 (×13): 40 mg via ORAL

## 2019-02-07 MED ORDER — ASPIRIN 81 MG PO CHEW
81 mg | Freq: Every day | ORAL | 0 refills | Status: DC
Start: 2019-02-07 — End: 2019-02-19
  Administered 2019-02-07 – 2019-02-19 (×13): 81 mg via ORAL

## 2019-02-07 MED ORDER — ENOXAPARIN 30 MG/0.3 ML SC SYRG
30 mg | Freq: Two times a day (BID) | SUBCUTANEOUS | 0 refills | Status: DC
Start: 2019-02-07 — End: 2019-02-19
  Administered 2019-02-07 – 2019-02-19 (×25): 30 mg via SUBCUTANEOUS

## 2019-02-07 MED ORDER — TAMSULOSIN 0.4 MG PO CAP
0.4 mg | Freq: Every day | ORAL | 0 refills | Status: DC
Start: 2019-02-07 — End: 2019-02-10
  Administered 2019-02-07 – 2019-02-10 (×4): 0.4 mg via ORAL

## 2019-02-07 MED ORDER — LORAZEPAM 2 MG/ML IJ SOLN
1 mg | Freq: Once | INTRAVENOUS | 0 refills | Status: CP
Start: 2019-02-07 — End: ?
  Administered 2019-02-07: 07:00:00 1 mg via INTRAVENOUS

## 2019-02-07 MED ORDER — SODIUM CHLORIDE 0.9 % IV SOLP
1000 mL | Freq: Once | INTRAVENOUS | 0 refills | Status: CP
Start: 2019-02-07 — End: ?
  Administered 2019-02-07: 11:00:00 1000 mL via INTRAVENOUS

## 2019-02-07 MED ORDER — LORAZEPAM 0.5 MG PO TAB
.5 mg | Freq: Once | ORAL | 0 refills | Status: CP
Start: 2019-02-07 — End: ?
  Administered 2019-02-07: 14:00:00 0.5 mg via ORAL

## 2019-02-07 MED ORDER — CLONAZEPAM 0.5 MG PO TAB
.5 mg | Freq: Two times a day (BID) | ORAL | 0 refills | Status: DC | PRN
Start: 2019-02-07 — End: 2019-02-19
  Administered 2019-02-07 – 2019-02-19 (×25): 0.5 mg via ORAL

## 2019-02-07 MED ORDER — CLOPIDOGREL 75 MG PO TAB
75 mg | Freq: Every day | ORAL | 0 refills | Status: DC
Start: 2019-02-07 — End: 2019-02-19
  Administered 2019-02-07 – 2019-02-19 (×13): 75 mg via ORAL

## 2019-02-07 MED ORDER — CLONAZEPAM 1 MG PO TAB
1 mg | Freq: Every evening | ORAL | 0 refills | Status: DC | PRN
Start: 2019-02-07 — End: 2019-02-19
  Administered 2019-02-08 – 2019-02-19 (×13): 1 mg via ORAL

## 2019-02-07 MED ORDER — ATORVASTATIN 40 MG PO TAB
40 mg | Freq: Every day | ORAL | 0 refills | Status: DC
Start: 2019-02-07 — End: 2019-02-19
  Administered 2019-02-07 – 2019-02-19 (×13): 40 mg via ORAL

## 2019-02-07 NOTE — ED Provider Notes
Charles Vaughan is a 63 y.o. male.    Chief Complaint:  Chief Complaint   Patient presents with   ??? Anxiety     Pt hyperventilating and SOB. Poor historian. Endorses CP.        History of Present Illness:  HPI   63 yo M with PMH of HTN and drug use presents to ER with anxiety and abdominal pain. Patient is a poor historian. Reports epigastric abdominal pain for last 3 days. He took Oxycontin today without relief. Per EMS, patient is a known crack cocaine user. Patient admits to last using 2 days ago. Reports difficulty urinating as well as nausea. Denies vomiting, diarrhea, constipation, melena, hematochezia. Patient states headache with blurred vision as well as chest pain and shortness of breath.     Review of Systems:  Review of Systems   Constitutional: Negative for activity change, fatigue and fever.   HENT: Negative for congestion, ear pain, rhinorrhea and sore throat.    Eyes: Positive for visual disturbance. Negative for pain and discharge.   Respiratory: Positive for shortness of breath. Negative for cough and wheezing.    Cardiovascular: Positive for chest pain. Negative for leg swelling.   Gastrointestinal: Positive for abdominal pain and nausea. Negative for abdominal distention, anal bleeding, blood in stool, constipation, diarrhea and vomiting.   Endocrine: Negative for polyuria.   Genitourinary: Positive for difficulty urinating. Negative for dysuria and hematuria.   Musculoskeletal: Negative for arthralgias.   Skin: Negative for rash and wound.   Neurological: Positive for headaches. Negative for dizziness and light-headedness.   Psychiatric/Behavioral: Positive for confusion. Negative for self-injury. The patient is nervous/anxious and is hyperactive.      Allergies:  Patient has no known allergies.    Past Medical History:  Medical History:   Diagnosis Date   ??? CAD (coronary artery disease)    ??? Hyperlipidemia        Past Surgical History:  Surgical History:   Procedure Laterality Date ??? ANGIOGRAPHY CORONARY ARTERY WITH LEFT HEART CATHETERIZATION N/A 06/13/2018    Performed by Laney Pastor, MD at [facility] CATH LAB   ??? PERCUTANEOUS CORONARY STENT PLACEMENT WITH ANGIOPLASTY N/A 06/13/2018    Performed by Laney Pastor, MD at [facility] CATH LAB       Social History:  Social History     Tobacco Use   ??? Smoking status: Former Smoker     Last attempt to quit: 06/09/2014     Years since quitting: 4.6   ??? Smokeless tobacco: Former Neurosurgeon   Substance Use Topics   ??? Alcohol use: Yes     Comment: states he drinks often but is unsure how often   ??? Drug use: Not Currently     Types: Cocaine     Comment: reports use 2 days ago      Social History     Substance and Sexual Activity   Drug Use Not Currently   ??? Types: Cocaine    Comment: reports use 2 days ago        Family History:  No family history on file.    Vitals:  ED Vitals    Date and Time T BP P RR SPO2P SPO2 User   02/07/19 0330 -- 110/80 114 17 PER MINUTE 114 96 % HS   02/07/19 0322 -- -- 111 18 PER MINUTE 109 93 % HS   02/07/19 0225 -- 142/88 116 19 PER MINUTE 116 97 % HS   02/07/19 0130 -- --  118 23 PER MINUTE 119 97 % HS          Physical Exam:  Physical Exam   Constitutional:       Appearance: Normal appearance.   HENT:      Head: Normocephalic and atraumatic.      Nose: Nose normal.   Eyes:      Extraocular Movements: Extraocular movements intact.      Conjunctiva/sclera: Conjunctivae normal.      Pupils: Pupils are equal, round, and reactive to light.   Neck:      Musculoskeletal: Normal range of motion.   Cardiovascular:      Rate and Rhythm: Regular rhythm. Tachycardia present.      Pulses: Normal pulses.      Heart sounds: No murmur. No gallop.    Pulmonary:      Effort: No respiratory distress.      Breath sounds: Normal breath sounds. No stridor. No wheezing or rhonchi.      Comments: Tachypnea  Abdominal:      General: Bowel sounds are normal. There is no distension.      Palpations: Abdomen is soft. There is no mass. Tenderness: There is no abdominal tenderness.      Hernia: No hernia is present.   Musculoskeletal: Normal range of motion.      Right lower leg: No edema.      Left lower leg: No edema.   Skin:     Findings: No rash.   Neurological:      Mental Status: He is alert. He is disoriented.      Cranial Nerves: No cranial nerve deficit.      Comments: Oriented to person and place only. Believes it is 1990.     Laboratory Results:  Labs Reviewed   CBC AND DIFF - Abnormal       Result Value Ref Range Status    White Blood Cells 7.9  4.5 - 11.0 K/UL Final    RBC 4.69  4.4 - 5.5 M/UL Final    Hemoglobin 13.3 (*) 13.5 - 16.5 GM/DL Final    Hematocrit 16.1 (*) 40 - 50 % Final    MCV 80.2  80 - 100 FL Final    MCH 28.3  26 - 34 PG Final    MCHC 35.3  32.0 - 36.0 G/DL Final    RDW 09.6 (*) 11 - 15 % Final    Platelet Count 365  150 - 400 K/UL Final    MPV 8.4  7 - 11 FL Final    Neutrophils 71  41 - 77 % Final    Lymphocytes 19 (*) 24 - 44 % Final    Monocytes 7  4 - 12 % Final    Eosinophils 1  0 - 5 % Final    Basophils 2  0 - 2 % Final    Absolute Neutrophil Count 5.66  1.8 - 7.0 K/UL Final    Absolute Lymph Count 1.48  1.0 - 4.8 K/UL Final    Absolute Monocyte Count 0.54  0 - 0.80 K/UL Final    Absolute Eosinophil Count 0.10  0 - 0.45 K/UL Final    Absolute Basophil Count 0.12  0 - 0.20 K/UL Final   COMPREHENSIVE METABOLIC PANEL - Abnormal    Sodium 121 (*) 137 - 147 MMOL/L Final    Potassium 4.0  3.5 - 5.1 MMOL/L Final    Chloride 88 (*) 98 - 110 MMOL/L Final  Glucose 93  70 - 100 MG/DL Final    Blood Urea Nitrogen 7  7 - 25 MG/DL Final    Creatinine 2.84  0.4 - 1.24 MG/DL Final    Calcium 9.4  8.5 - 10.6 MG/DL Final    Total Protein 6.8  6.0 - 8.0 G/DL Final    Total Bilirubin 0.7  0.3 - 1.2 MG/DL Final    Albumin 4.1  3.5 - 5.0 G/DL Final    Alk Phosphatase 104  25 - 110 U/L Final    AST (SGOT) 18  7 - 40 U/L Final    CO2 15 (*) 21 - 30 MMOL/L Final    ALT (SGPT) 12  7 - 56 U/L Final    Anion Gap 18 (*) 3 - 12 Final eGFR Non African American >60  >60 mL/min Final    eGFR African American >60  >60 mL/min Final   URINALYSIS DIPSTICK - Abnormal    Color,UA STRAW   Final    Turbidity,UA CLEAR  CLEAR-CLEAR Final    Specific Gravity-Urine 1.002 (*) 1.003 - 1.035 Final    pH,UA 6.0  5.0 - 8.0 Final    Protein,UA NEG  NEG-NEG Final    Glucose,UA NEG  NEG-NEG Final    Ketones,UA TRACE (*) NEG-NEG Final    Bilirubin,UA NEG  NEG-NEG Final    Blood,UA NEG  NEG-NEG Final    Urobilinogen,UA NORMAL  NORM-NORMAL Final    Nitrite,UA NEG  NEG-NEG Final    Leukocytes,UA NEG  NEG-NEG Final    Urine Ascorbic Acid, UA NEG  NEG-NEG Final   COCAINE-URINE RANDOM - Abnormal    Cocaine-Urine POS (*) NEG-NEG Final   POC BLOOD GAS VEN - Abnormal    PH-VEN-POC 7.49 (*) 7.30 - 7.40 Final    PCO2-VEN-POC 26 (*) 36 - 50 MMHG Final    PO2-VEN-POC 52 (*) 33 - 48 MMHG Final    Base Def-VEN-POC 4.0  MMOL/L Final    O2 Sat-VEN-POC 90.0 (*) 55 - 71 % Final    Bicarbonate-VEN-POC 19.6  MMOL/L Final   POC SODIUM - Abnormal    Sodium-POC 124 (*) 137 - 147 MMOL/L Final   BETA HYDROXYBUTYRATE (KETONES) - Abnormal    Beta Hydroxybutyrate 2.9 (*) <0.3 MMOL/L Final   COVID-19 (SARS-COV-2) PCR   LIPASE    Lipase 30  11 - 82 U/L Final   URINALYSIS, MICROSCOPIC    WBCs,UA 0-2  0 - 2 /HPF Final    RBCs,UA 0-2  0 - 3 /HPF Final   AMPHETAMINES-URINE RANDOM    Amphetamines NEG  NEG-NEG Final   BARBITURATES-URINE RANDOM    Barbiturates,Urine NEG  NEG-NEG Final   BENZODIAZEPINES-URINE RANDOM    Benzodiazepines NEG  NEG-NEG Final   CANNABINOIDS-URINE RANDOM    THC NEG  NEG-NEG Final   OPIATES-URINE RANDOM    Opiates-Urine NEG  NEG-NEG Final   PHENCYCLIDINES-URINE RANDOM    Phencyclidine (PCP) NEG  NEG-NEG Final   POC GLUCOSE    Glucose, POC 96  70 - 100 MG/DL Final   POC LACTATE    LACTIC ACID POC 1.7  0.5 - 2.0 MMOL/L Final   POC TROPONIN    Troponin-I-POC 0.00  0.00 - 0.05 NG/ML Final   POC HEMATOCRIT    Hemoglobin POC 13.9  13.5 - 16.5 GM/DL Final Hematocrit POC 13.2  40 - 50 % Final   POC POTASSIUM    Potassium-POC 3.6  3.5 - 5.1 MMOL/L Final   POC  IONIZED CALCIUM    Ionized Calcium-POC 1.17  1.0 - 1.3 MMOL/L Final   SODIUM-URINE RANDOM    Sodium, Random <10  MMOL/L Final   OSMOLALITY-URINE RANDOM    Osmolality-Urine 64  50 - 1,400 MOS/KG Final   BLOOD GASES, PERIPHERAL VENOUS   URINE CULTURE GRAY TOP   BLUE TOP TUBE   MINT TOP TUBE   LAVENDER TOP TUBE   BETA HYDROXYBUTYRATE (KETONES)   ACETAMINOPHEN LEVEL   SALICYLATE LEVEL   POC TROPONIN   POC GLUCOSE   POC LACTATE   POC LACTATE     POC Glucose (Download): 96    Radiology Interpretation:    CT ABD/PELV W CONTRAST   Final Result   Abnormal         CHEST:      1.  No evidence of acute central or proximal segmental pulmonary embolism. Evaluation of the more peripheral pulmonary arteries limited by motion.   2.  Mild bronchial wall thickening which can be seen with bronchitis or reactive airways disease. No consolidating pneumonia.   3.  Stable tiny right lower lobe nodule, likely a noncalcified granuloma. If the patient is high risk, optional follow-up CT chest in February 2021 could be obtained to assess for one year stability.      ABDOMEN AND PELVIS:      No abdominopelvic inflammatory mass, bowel obstruction, ascites, or free air.      #FOLLOW      By my electronic signature, I attest that I have personally reviewed the images for this examination and formulated the interpretations and opinions expressed in this report          Finalized by Jason Coop, M.D. on 02/07/2019 3:04 AM. Dictated by Gwinda Passe, MD on 02/07/2019 2:41 AM.         CTA CHEST WO/W CONTRAST+POST IMPRESSION   Final Result   Abnormal         CHEST:      1.  No evidence of acute central or proximal segmental pulmonary embolism. Evaluation of the more peripheral pulmonary arteries limited by motion.   2.  Mild bronchial wall thickening which can be seen with bronchitis or reactive airways disease. No consolidating pneumonia. 3.  Stable tiny right lower lobe nodule, likely a noncalcified granuloma. If the patient is high risk, optional follow-up CT chest in February 2021 could be obtained to assess for one year stability.      ABDOMEN AND PELVIS:      No abdominopelvic inflammatory mass, bowel obstruction, ascites, or free air.      #FOLLOW      By my electronic signature, I attest that I have personally reviewed the images for this examination and formulated the interpretations and opinions expressed in this report          Finalized by Jason Coop, M.D. on 02/07/2019 3:04 AM. Dictated by Gwinda Passe, MD on 02/07/2019 2:41 AM.         CT HEAD WO CONTRAST   Final Result         Mildly motion degraded exam without evidence of acute intracranial hemorrhage or mass effect.      By my electronic signature, I attest that I have personally reviewed the images for this examination and formulated the interpretations and opinions expressed in this report          Finalized by Jason Coop, M.D. on 02/07/2019 2:45 AM. Dictated by Gwinda Passe, MD on 02/07/2019 2:35 AM.  EKG:    HR 110. QTc 440. Sinus tachycardia. Limited by motion. No STEMI.     ED Course:  - Patient seen and examined by resident and attending for abdominal pain, anxiety and altered mental status.   - Vitals signs were for elevated BP and tachycardia. Mild tachypnea.   - Physical exam as above. Patient disoriented, labored breathing with mild tachypnea. Abdomen diffusely tender to palpation.   - Differential diagnosis initially includes but not limited PE, head trauma, drug use, acute abdominal pathology, starvation ketosis, electrolyte derangement.   - Workup including EKG, labs and imaging.    - Labs: Trop 0.01, Ketones 2.9, Lipase WNL, Na 121, Cl 88, CO2 15, AG 18, CBC WNL, Lactate 1.7, UDS pos for cocaine, VBG 7.49/26/52/19.6.   - Imaging as above without acute findings.   - Patient was given Zofran and Ativan. - Unclear etiology for overall presentation. Possibly acute encephalopathy secondary to hyponatremia as well as starvation ketosis. Unable to obtain clear alcohol history for possible beer potomania. Urine lytes obtained. Tylenol and Salicylate levels ordered.     - Patient was admitted to IM for further management of acute encephalopathy, hyponatremia, elevated anion gap and ketosis.        ED Scoring:                             Coding    Facility Administered Meds:  Medications   LORazepam (ATIVAN) injection 1 mg (1 mg Intravenous Given 02/07/19 0143)   ondansetron (ZOFRAN) injection 4 mg (4 mg Intravenous Given 02/07/19 0337)     LORazepam (ATIVAN) injection 1 mg (has no administration in time range)    Clinical Impression:  Clinical Impression   Hyponatremia   Ketosis (HCC)   Anxiety state   Altered mental status, unspecified altered mental status type       Disposition/Follow up  ED Disposition     ED Disposition    Admit        No follow-up provider specified.    Medications:  New Prescriptions    No medications on file       Procedure Notes:  Procedures      Attestation / Supervision:

## 2019-02-07 NOTE — ED Notes
Pt attempted to use urinal at this time; pt bearing down to attempt to urinate in urinal at bedside. Pt states he cannot urinate at this time.

## 2019-02-08 MED ORDER — IPRATROPIUM-ALBUTEROL 20-100 MCG/ACTUATION IN MIST
1 | RESPIRATORY_TRACT | 0 refills | Status: DC | PRN
Start: 2019-02-08 — End: 2019-02-19
  Administered 2019-02-09 – 2019-02-10 (×2): 1 via RESPIRATORY_TRACT

## 2019-02-08 NOTE — Progress Notes
Admission History and Physical Examination      Name:  Charles Vaughan                                             MRN:  [id number]   Admission Date:  02/07/2019                     Assessment/Plan:      #Abdominal pain- resolved.  -Onset 3 days ago  -Lower abdomen radiating upwards  -7 out of 10 pain, constant  -Some improvement with morphine  -No change with food  -Lactic acid 1.7  -CT abdomen pelvis with contrast               -No radiopaque gallstones, no bile ductal dilatation, unremarkable kidney and ureters, unremarkable pancreas               -Reactive periportal lymph nodes               -Abdominal aorta and iliac arteries normal with moderate plaque               -No ascites/free air, no large or small bowel loop abnormalities               -Bladder is partially distended, prostate unremarkable  -CTA chest               -No evidence of acute segmental pulmonary embolism                            -Peripheral pulmonary arteries limited by motion               -Mild bronchial wall thickening, no pneumonia  -Pain could be a result of urinary retentionversus GERD  -Has no obvious reason for peritonitis, lactic acid 1.7 is against ischemia  -CT abdomen pelvis done with contrast which is not ideal to evaluate abdominal aortic dissection.  While patient is at high risk due to cocaine use it is less likely that has been ongoing for 3 days and BP has been stable throughout his ED visit  -Could be related to bladder spasms and urinary retention  Plan:               > Maintain Foley catheter               > GI cocktail  - will order PT/OT, order voiding trial, if he passes these, will dc home in the next couple of days. Likely his symptoms are from drug abuse and dehydration overall.   ???  #Anion gap metabolic acidosis  #Respiratory alkalosis  - resolved  -Serum Osm 250, suggesting that there are no other alcohols causing Gap -Poor p.o. intake over the last few weeks worse over the last few days  -Suspect that anion gap is result of starvation ketosis  -Suspect that respiratory alkalosis is secondary to hyperventilation from abdominal pain vs anxiety  -Trop neg x3  -EKG sinus tach, HR 114, twi V2, no ST changes, significant artifact  Plan:               > Pain control as above               > Repeat BMP               >  trial 0.5mg  ativan, avoid atarax with retention  ???  #Hyponatremia  - resolved  -Urine Osm 64, urine sodium less than 10  -Serum Osm 250, specific gravity 1.002  -Consistent with ADH independent cause  -Less likely psychogenic polydipsia or renal failure  -More likely a result of poor solute intake  -Could also be related to urinary retention  Plan:               > NS 1 L               > Repeat BMP in 6 hours  ???  #Urinary retention  -Foley placed in ED, 700 cc out               > Maintain Foley               > tamsulosin  ???  #CAD status post DES LAD  #HTN  #HLD  -PCI October 2019               > Continue PTA atorvastatin, Plavix, aspirin, Lopressor  ???  #Alcohol use disorder   #Cocaine use  -UDS positive for cocaine  -Denies any recent alcohol  ???  #COVID positive  -Reportedly tested positive for covid 4 to 5 weeks ago then negative               > We will repeat test here    FEN:  - IVF NS at 35ml/hr  - replace electrolytes as needed.  - Diet: Low sodium    Ppx- Lovenox SQ    Full code    Oneida Arenas, MD  Hospitalist    _____________________________________________________________________________    Subjective:  Patient was comfortably lying in bed, no acute events overnight, no new complaints this am. Patient denies fevers, chills, nausea, vomiting, or pain this am.    Review of Systems:  Complete 10 point review of system was obtained.  It is positive per HPI.  All other review of system was negative.    Physical Exam:  Vital Signs: Last Filed In 24 Hours Vital Signs: 24 Hour Range   BP: 136/72 (06/14 0758) Temp: 36.4 ???C (97.5 ???F) (06/14 0758)  Pulse: 72 (06/14 0758)  Respirations: 16 PER MINUTE (06/14 0758)  SpO2: 100 % (06/14 0758) BP: (92-136)/(48-72)   Temp:  [36.2 ???C (97.1 ???F)-36.6 ???C (97.9 ???F)]   Pulse:  [63-81]   Respirations:  [16 PER MINUTE-18 PER MINUTE]   SpO2:  [95 %-100 %]    Intensity Pain Scale (Self Report): 4 (02/07/19 2052)      Constitutional: Alert and oriented times three. No acute distress. Answer questions appropriately.   HEENT: Normal conjunctivae. Pupils equal, round and reactive. Extraocular muscles are intact.  Neck: Supple. No thyroidmegaly. No carotid bruits. No jugular venous distension.   Cardiovascular: Regular rhythm and rate. Normal S1 and S2. No murmurs, rubs, or gallop.   Respiratory: Breathing comfortable without use of accessory muscles. Breath sounds equal bilaterally. No crackles, wheezes, or rhonchi.   Gastrointestinal: Normal bowel sounds.  Not distended. No tenderness to palpation. No guarding or rebound. No organomegaly.   Musculoskeletal: No clubbing, cyanosis, or edema.     Lab/Radiology/Other Diagnostic Tests:  Results for orders placed or performed during the hospital encounter of 02/07/19 (from the past 24 hour(s))   BASIC METABOLIC PANEL    Collection Time: 02/07/19 10:02 PM   Result Value Ref Range    Sodium 137 137 - 147 MMOL/L  Potassium 3.9 3.5 - 5.1 MMOL/L    Chloride 104 98 - 110 MMOL/L    CO2 24 21 - 30 MMOL/L    Anion Gap 9 3 - 12    Glucose 97 70 - 100 MG/DL    Blood Urea Nitrogen 10 7 - 25 MG/DL    Creatinine 5.62 0.4 - 1.24 MG/DL    Calcium 8.6 8.5 - 13.0 MG/DL    eGFR Non African American >60 >60 mL/min    eGFR African American >60 >60 mL/min   CBC AND DIFF    Collection Time: 02/08/19  4:52 AM   Result Value Ref Range    White Blood Cells 5.6 4.5 - 11.0 K/UL    RBC 4.31 (L) 4.4 - 5.5 M/UL    Hemoglobin 12.1 (L) 13.5 - 16.5 GM/DL    Hematocrit 86.5 (L) 40 - 50 %    MCV 82.1 80 - 100 FL    MCH 28.0 26 - 34 PG    MCHC 34.1 32.0 - 36.0 G/DL RDW 78.4 (H) 11 - 15 %    Platelet Count 292 150 - 400 K/UL    MPV 7.9 7 - 11 FL    Neutrophils 61 41 - 77 %    Lymphocytes 26 24 - 44 %    Monocytes 10 4 - 12 %    Eosinophils 2 0 - 5 %    Basophils 1 0 - 2 %    Absolute Neutrophil Count 3.42 1.8 - 7.0 K/UL    Absolute Lymph Count 1.46 1.0 - 4.8 K/UL    Absolute Monocyte Count 0.56 0 - 0.80 K/UL    Absolute Eosinophil Count 0.13 0 - 0.45 K/UL    Absolute Basophil Count 0.04 0 - 0.20 K/UL   COMPREHENSIVE METABOLIC PANEL    Collection Time: 02/08/19  4:52 AM   Result Value Ref Range    Sodium 141 137 - 147 MMOL/L    Potassium 3.6 3.5 - 5.1 MMOL/L    Chloride 109 98 - 110 MMOL/L    Glucose 91 70 - 100 MG/DL    Blood Urea Nitrogen 9 7 - 25 MG/DL    Creatinine 6.96 0.4 - 1.24 MG/DL    Calcium 8.7 8.5 - 29.5 MG/DL    Total Protein 5.7 (L) 6.0 - 8.0 G/DL    Total Bilirubin 0.2 (L) 0.3 - 1.2 MG/DL    Albumin 3.4 (L) 3.5 - 5.0 G/DL    Alk Phosphatase 80 25 - 110 U/L    AST (SGOT) 10 7 - 40 U/L    CO2 25 21 - 30 MMOL/L    ALT (SGPT) 9 7 - 56 U/L    Anion Gap 7 3 - 12    eGFR Non African American >60 >60 mL/min    eGFR African American >60 >60 mL/min   MAGNESIUM    Collection Time: 02/08/19  4:52 AM   Result Value Ref Range    Magnesium 2.0 1.6 - 2.6 mg/dL   PHOSPHORUS    Collection Time: 02/08/19  4:52 AM   Result Value Ref Range    Phosphorus 3.0 2.0 - 4.5 MG/DL      No results found.  Pertinent radiology reviewed.    Sabino Snipes, MD

## 2019-02-09 ENCOUNTER — Encounter: Admit: 2019-02-09 | Discharge: 2019-02-09

## 2019-02-09 DIAGNOSIS — R109 Unspecified abdominal pain: Secondary | ICD-10-CM

## 2019-02-09 MED ORDER — TAMSULOSIN 0.4 MG PO CAP
0.4 mg | ORAL_CAPSULE | Freq: Every day | ORAL | 0 refills | Status: CN
Start: 2019-02-09 — End: ?

## 2019-02-09 NOTE — Consults
Wound Ostomy Note    NAME:Charles Vaughan                                                                   MRN: [id number]                 DOB:Oct 21, 1955          AGE: 63 y.o.  ADMISSION DATE: 02/07/2019             DAYS ADMITTED: LOS: 2 days      Reason for Consult/Visit: pressure injury Stage II or greater    Assessment/Plan:    Principal Problem:    Abdominal pain    Charles Vaughan is a 64 y.o. male with past medical history significant for alcohol use, cocaine use, coronary artery disease status post PCI, hypertension, hyperlipidemia. Presents the ED with complaints of abdominal pain anxiety that is been going on for about 3 days now.     Pressure Injury 02/07/19 0544 Yes Coccyx Stage 1 (Active)   02/07/19 0544    Pressure Injury Present On Inpatient Admission: Y   Pressure Injury Orientation:    Wound Location: Coccyx   Pressure Injury Stages: Stage 1   If this pressure injury is suspected to be device related, please select the device::    Wound Image   02/09/2019 10:44 AM   Wound Dressing Status Open to Air 02/09/2019 10:44 AM   Wound Dressing and / or Treatment Criticaid Barrier Cream 02/09/2019 10:44 AM   Wound Drainage Amount None 02/09/2019 10:44 AM   Wound Base Assessment Intact;Pink 02/09/2019 10:44 AM   Surrounding Skin Assessment Dry;Intact 02/09/2019 10:44 AM     Wound Team consulted re: coccyx pressure injury.     Above photograph provided by bedside nurse. Skin over coccyx intact and pink - stage 1 pressure injury. Patient is continent of bowel and bladder. Nursing is applying barrier cream.     PLAN: Apply barrier cream to affected area BID. Turn Q2 hours using foam wedge for support. Use only disposable underpad beneath patient, avoid placing briefs to prevent heat/moisture trapping. HOB <30 degrees to prevent shearing.     ---Wound care and maintenance orders placed per skin integrity protocol.  If in agreement, primary team is responsible for placing any orders for consults, tests and/or procedures.---    Wound Team will sign off. Thank you.     Sanda Klein, RN, BSN, [facility]  Wound/Ostomy Team   Pager: [phone]  After Hours Wound/OstomyTeam Pager: [phone]

## 2019-02-09 NOTE — Progress Notes
OCCUPATIONAL THERAPY    Based on discussion with PT, patient's current level of function suggests that patient will progress with onediscipline. OT will continue to monitor and initiate intervention if pt demonstrates a decline in function.     [place], [place] R/L [place]

## 2019-02-09 NOTE — Progress Notes
Patient arrived to room # [phone]) via bed accompanied by transport. Patient transferred to the bed with assistance. Bedside safety checks completed. Initial patient assessment completed. Refer to flowsheet for details.    Admission skin assessment completed with: Max, RN    Pressure injury present on arrival?: Yes    1. Head/Face/Neck: Yes  2. Trunk/Back: No  3. Upper Extremities: No  4. Lower Extremities: No  5. Pelvic/Coccyx: Yes  6. Assessed for device associated injury? Yes  7. Malnutrition Screening Tool (Nursing Nutrition Assessment) Completed? No    See Doc Flowsheet for additional wound details.     INTERVENTIONS:     Stage 1 coccyx  Stage 1 right ear

## 2019-02-09 NOTE — Case Management (ED)
Case Management Admission Assessment    NAME:Charles Vaughan                          MRN: [id number]             DOB:Jul 17, 1956          AGE: 63 y.o.  ADMISSION DATE: 02/07/2019             DAYS ADMITTED: LOS: 2 days      Today???s Date: 02/09/2019    Source of Information: patient       Plan  Plan: Case Management Assessment, Assist PRN with SW/NCM Services, Discharge Planning for Home Anticipated  ??? NCM spoke with patient over the phone d/t COVID precautions - introduced self and role. NCM gave contact information.  ??? Reviewed [facility], Preparing for Discharge, and Preferred Provider Network hand-outs.  ??? Pt/family encouraged to contact Case Management team with questions and concerns during hospitalization and until patient is able to transition back to the patient's primary care physician.  ??? NCM reviewed EMR and discussed pt with team.   ??? Pt stated that he lives with two roommates and that he is unsure of how to quarantine at home. NCM verified that pt has his own room at home and that he would be able to quarantine in his room away from his roommates. NCM continued to educate pt that he would not be able to quarantine in the hospital for 2 weeks. Per IPAC - Pt is COVID recovered.   ??? Pt stated that he does not have a PCP, NCM asked pt if he would like to establish care and pt stated that he will take care of it later.   ??? Pt complained of falling at home - PT recommending home with a RW at D/C. Christoper Allegra is in network with pts insurance and per pt choice, he would like to use [employer] for DME. Orders pended in Epic.  ??? Pt stated that he has a drug history - Pt positive for cocaine on admission and stated that he has done some oxycodone in the past - Pt stated that it was last Tuesday. Pt denied the need for resources stating that, I just need to stop taking them. NCM updated MPB team.   ??? D/C plan ongoing, will continue to follow.     Patient Address/Phone  [address]  [place] [address] [phone] (home)     Emergency Contact  Extended Emergency Contact Information  Primary Emergency Contact: Yankee,Brandy  Mobile Phone: [phone]  Relation: Daughter  Preferred language: ENGLISH  Interpreter needed? No    Forensic scientist: No, patient does not have a healthcare directive  Would patient like to fill out a (a new) Healthcare Directive?: No, patient declined  Psych Advance Directive (Psych unit only): No, patient does not have a Social research officer, government  Does the patient need discharge transport arranged?: No  Transportation Name, Phone and Availability #1: One of pts roommates  Transportation Name, Phone and Availability #2: cab pass  Does the patient use Medicaid Transportation?: No    Expected Discharge Date  Expected Discharge Date: 02/10/19  Expected Discharge Time: 1500    Living Situation Prior to Admission  ? Living Arrangements  Type of Residence: Home, independent  Living Arrangements: Friends(Pt lives with 2 roommates)  Financial risk analyst / Tub: Tub/Shower Unit  How many levels in the residence?:  1  Can patient live on one level if needed?: N/A  Does residence have entry and/or side stairs?: Yes(6-7)  Assistance needed prior to admit or anticipated on discharge: No  Who provides assistance or could if needed?: Pts roommates  Are they in good health?: Unknown  Can support system provide 24/7 care if needed?: Maybe  ? Level of Function   Prior level of function: Independent  ? Cognitive Abilities   Cognitive Abilities: Alert and Oriented, Participates in decision making    Financial Resources  ? Coverage  Primary Insurance: Theatre manager Coverage: RX    ? Source of Income   Source Of Income: Other retirement income  ? Financial Assistance Needed?  N/A    Psychosocial Needs  ? Mental Health  Mental Health History: In the past  Agency name: [facility] in [place] (Pt has not been in a while) Mental Health Symptoms: Feeling depressed, Excessive fears/worries  ? Substance Use History  Substance Use History Screen: Yes  Comment: Pt positive for cocaine on admission (pt stated that hes had some oxycodones which were not prescribed to him last Tuesday) and states that he drinks some  ? Other  N/A    Current/Previous Services  ? PCP  No Pcp, Na, None, None  ? Pharmacy    [id number] [place], [place] - [address] AT [facility] OF [address]  [address] [place] [address]  Phone: [phone] Fax: [phone]    ? Durable Medical Equipment   Durable Medical Equipment at home: None  ? Home Health  Receiving home health: No  ? Hemodialysis or Peritoneal Dialysis  Undergoing hemodialysis or peritoneal dialysis: No  ? Tube/Enteral Feeds  Receive tube/enteral feeds: No  ? Infusion  Receive infusions: No  ? Private Duty  Private duty help used: No  ? Home and Community Based Services  Home and community based services: No  ? [employer]: N/A  ? Hospice  Hospice: No  ? Outpatient Therapy  PT: No  OT: No  SLP: No  ? Skilled Nursing Facility/Nursing Home  SNF: In the past  When did patient receive care?: Feb 2020  Name of Facility: [facility]  Would patient return for future services?: No  NH: No  ? Inpatient Rehab  IPR: No  ? Long-Term Acute Care Hospital  LTACH: No  ? Acute Hospital Stay  Acute Hospital Stay: In the past  Was patient's stay within the last 30 days?: No    Lynn Ito, RN, BSN  Nurse Case Manager  Phone: [phone]  Pager: [phone]

## 2019-02-09 NOTE — Progress Notes
Discontinuation of Transmission-Based Precautions for COVID-19 and Cleaning of Room Process    IPAC consulted for consideration to remove patient from COVID-19 isolation. It was determined that patient meets recovery criteria on 02/09/2019, originally positive 12/03/2018.  COVID flag resolved and isolation orders discontinued. Patient is to remain in isolation until transferred to a new unit/clean room.     Please note that subsequent COVID PCR tests may continue to result positive; however, that is not reflective of active viral infection. Patients are known to test positive via PCR for weeks after the infectious phase of illness (see reference below for further info).    Room cleaning post-discharge of suspected or confirmed COVID-19 patients:  ? INPATIENT UNITS following discharge of patient in Contact and Droplet precautions with eye protection   ? Unit staff strips room and wipes down any equipment that will leave the room (ex. IV pump, SCD pumps)   ? Isolation signage should remain in place for clear communication to EVS staff   ? No downtime of room is required before terminal clean can be performed by EVS staff   ? EVS staff will use Contact and Droplet Precautions with eye protection   ? PPE needed: surgical mask, goggles/face shield, gown, and gloves  ? Use Oxycide with appropriate contact/dwell time   ? Room can re-open when dry      Reference information from the policy ???Care of a Patient with COVID-19???  Recovery criteria:  Current guidance for discontinuation of isolation precautions applies to COVID-19 positive patients on acute care status. ICU status COVID-19 patients will remain in isolation for duration of ICU encounter.     COVID-19 patients with confirmatory laboratory testing on acute care status are considered recovered and isolation precautions can be discontinued if the following criteria are met:  ? At least 3 days (72 hours) have passed since recovery defined as resolution of fever without the use of fever-reducing medications and improvement in respiratory symptoms (e.g., cough, shortness of breath); and,   ? At least 14 days have passed since first diagnostic test.      Rationale: At this time, replication-competent virus has not been successfully cultured more than 9 days after onset of illness. The statistically estimated likelihood of recovering replication-competent virus approaches zero by 10 days (CDC unpublished data, W???lfel 2020, Arons 2020). Available data indicate that shedding of SARS-CoV-2 RNA in upper respiratory specimens declines after onset of symptoms. At 10 days after illness onset, recovery of replication-competent virus in viral culture (as a proxy of the presence of infectious virus) is decreased and approaches zero. Although patients may produce PCR-positive specimens for up to 6 weeks Leeroy Cha, 2020), it remains unknown whether these PCR-positive samples represent the presence of infectious virus. After clinical recovery, many patients do not continue to shed SARS-CoV-2 viral RNA. Among recovered patients with detectable RNA in upper respiratory specimens, concentrations of RNA after 3 days are generally in ranges where virus has not been reliably cultured by CDC. These data have been generated from adults across a variety of age groups and with varying severity of illness.    We rely on the best evidence available to approximate when and for how long someone may be infectious to others. The health system's discontinuation of isolation guideline was developed with more stringent criteria than what is currently recommended by state and national public health officials. We acknowledge that illness onset likely occurs prior to diagnostic testing. Setting recovery criteria 14 days after the first diagnostic test assures  that we are isolating beyond the duration of infectivity based on available evidence.

## 2019-02-09 NOTE — Progress Notes
I have reviewed the notes, assessment, and/or procedures performed by Jordan G., RN and concur with her documentation unless otherwise noted.

## 2019-02-09 NOTE — Progress Notes
Admission History and Physical Examination      Name:  Charles Vaughan                                             MRN:  [id number]   Admission Date:  02/07/2019                     Assessment/Plan:      #Dizziness  #Falls  - Orthostatics negative  - Slowed/abnormal finger to nose in b/l UES, no nystagmus on exam, normal heel to shin, no focal deficits  - PT also evaluated at bedside  - Patient notes multiple falls at home and does not feels safe going home at this time  - He does note history of spinal surgery when he was a teenager  PLAN:  - Will order MIR Head and Neck to assess for posterior stroke, C spine abnormalities  - PT/OT to follow    #Abdominal pain- resolved  -Onset 3 days ago  -Lower abdomen radiating upwards  -7 out of 10 pain, constant  -Some improvement with morphine  -No change with food  -Lactic acid 1.7  -CT abdomen pelvis with contrast               -No radiopaque gallstones, no bile ductal dilatation, unremarkable kidney and ureters, unremarkable pancreas               -Reactive periportal lymph nodes               -Abdominal aorta and iliac arteries normal with moderate plaque               -No ascites/free air, no large or small bowel loop abnormalities               -Bladder is partially distended, prostate unremarkable  -CTA chest               -No evidence of acute segmental pulmonary embolism               -Peripheral pulmonary arteries limited by motion               -Mild bronchial wall thickening, no pneumonia  -Has no obvious reason for peritonitis, lactic acid 1.7 is against ischemia  -CT abdomen pelvis done with contrast which is not ideal to evaluate abdominal aortic dissection.  While patient is at high risk due to cocaine use it is less likely that has been ongoing for 3 days and BP has been stable throughout his ED visit  -Could be related to bladder spasms and urinary retention  PLAN:  - Continue to monitor - Foley removed and no evidence of retention thus far. Continue Flomax.  ???  #Anion gap metabolic acidosis  #Respiratory alkalosis - resolved  -Serum Osm 250, suggesting that there are no other alcohols causing Gap  -Poor p.o. intake over the last few weeks worse over the last few days  -Suspect that anion gap is result of starvation ketosis  -Suspect that respiratory alkalosis is secondary to hyperventilation from abdominal pain vs anxiety  -Trop neg x3  -EKG sinus tach, HR 114, twi V2, no ST changes, significant artifact  PLAN:  - Monitor BMP  ???  #Hyponatremia - resolved  - Urine Osm 64, urine sodium less than 10  - Serum Osm 250, specific  gravity 1.002  - Consistent with ADH independent cause  - Less likely psychogenic polydipsia or renal failure  - More likely a result of poor solute intake  - Could also be related to urinary retention  PLAN:  - Monitor BMP  ???  #Urinary retention  - Foley placed in ED, 700 cc out  - Foley removed 6/15 with no evidence of retention  - Continue Tamsulosin  ???  #CAD s/p DES LAD  #HTN  #HLD  - PCI in October 2019  - Continue PTA Atorvastatin, Plavix, Aspirin, Lopressor  ???  #Alcohol use disorder   #Cocaine use  - UDS positive for cocaine  - Denies any recent alcohol use  ???  #COVID positive  - Reportedly tested positive for COVID-19 4 to 5 weeks ago then negative on repeat. Repeat testing 6/13 positive. Discussed with IPAC who notes okay to remove patient from isolation.    FEN:  - No IVF  - Replace electrolytes as needed  - Diet: Low sodium    Ppx: Lovenox     Full code    Greater than 35 minutes spent with pt with >50% of the time counseling and coordinating care. This time included discussion of medical issues, interpretation of data and educating her as to her condition and treatment options.  _____________________________________________________________________________    Subjective:  No events reported overnight. The patient notes he is feeling okay. He notes he doe not feel ready to go home because he is worried about falling at home. He notes 20 falls in the last 3 months with multiple falls in the last week prior to admission. He states after he has been sitting up for a while he will feel really dizzy and then feel like he is going to pass out. He denies dizziness with head movement. He states he feels the room is spinning and will lose his balance.    He also notes persistent symptoms such as cough and SOA. He denies f/c, CP.    Review of Systems:  Complete 10 point review of system was obtained. It is positive per HPI. All other review of system was negative.    Physical Exam:  Vital Signs: Last Filed In 24 Hours Vital Signs: 24 Hour Range   BP: 153/85 (06/15 0737)  Temp: 36.4 ???C (97.6 ???F) (06/15 9604)  Pulse: 59 (06/15 0809)  Respirations: 18 PER MINUTE (06/15 0737)  SpO2: 97 % (06/15 0737) BP: (107-153)/(59-85)   Temp:  [36.2 ???C (97.1 ???F)-36.4 ???C (97.6 ???F)]   Pulse:  [52-78]   Respirations:  [16 PER MINUTE-18 PER MINUTE]   SpO2:  [95 %-100 %]             General:  Alert, cooperative, no distress, appears stated age  Head:  Normocephalic, without obvious abnormality, atraumatic  Eyes:  Conjunctivae/corneas clear. PERRL, EOMs intact.   Neck:    Supple, symmetrical, trachea midline  Lungs:  Clear to auscultation bilaterally  Heart:   Regular rate and rhythm, S1, S2 normal, no murmur, click rub or gallop  Abdomen:  Soft, non-tender.  Bowel sounds normal.   Extremities: Extremities normal, atraumatic, no cyanosis or edema  Skin: Skin color, texture, turgor normal.  No rashes or lesions  Neurologic: Alert and oriented x 3, no focal deficits. CNII - XII intact. No nystagmus. Abnormal finger to nose b/l (slowed), normal heel to shin. Normal strength.      Lab/Radiology/Other Diagnostic Tests:  Results for orders placed or performed during the hospital  encounter of 02/07/19 (from the past 24 hour(s))   CBC AND DIFF    Collection Time: 02/09/19  4:39 AM Result Value Ref Range    White Blood Cells 5.2 4.5 - 11.0 K/UL    RBC 4.07 (L) 4.4 - 5.5 M/UL    Hemoglobin 11.6 (L) 13.5 - 16.5 GM/DL    Hematocrit 16.1 (L) 40 - 50 %    MCV 81.7 80 - 100 FL    MCH 28.5 26 - 34 PG    MCHC 34.8 32.0 - 36.0 G/DL    RDW 09.6 (H) 11 - 15 %    Platelet Count 303 150 - 400 K/UL    MPV 8.2 7 - 11 FL    Neutrophils 53 41 - 77 %    Lymphocytes 32 24 - 44 %    Monocytes 10 4 - 12 %    Eosinophils 4 0 - 5 %    Basophils 1 0 - 2 %    Absolute Neutrophil Count 2.77 1.8 - 7.0 K/UL    Absolute Lymph Count 1.66 1.0 - 4.8 K/UL    Absolute Monocyte Count 0.52 0 - 0.80 K/UL    Absolute Eosinophil Count 0.18 0 - 0.45 K/UL    Absolute Basophil Count 0.05 0 - 0.20 K/UL   COMPREHENSIVE METABOLIC PANEL    Collection Time: 02/09/19  4:39 AM   Result Value Ref Range    Sodium 141 137 - 147 MMOL/L    Potassium 3.9 3.5 - 5.1 MMOL/L    Chloride 110 98 - 110 MMOL/L    Glucose 91 70 - 100 MG/DL    Blood Urea Nitrogen 10 7 - 25 MG/DL    Creatinine 0.45 0.4 - 1.24 MG/DL    Calcium 8.7 8.5 - 40.9 MG/DL    Total Protein 5.6 (L) 6.0 - 8.0 G/DL    Total Bilirubin 0.2 (L) 0.3 - 1.2 MG/DL    Albumin 3.3 (L) 3.5 - 5.0 G/DL    Alk Phosphatase 79 25 - 110 U/L    AST (SGOT) 9 7 - 40 U/L    CO2 25 21 - 30 MMOL/L    ALT (SGPT) 9 7 - 56 U/L    Anion Gap 6 3 - 12    eGFR Non African American >60 >60 mL/min    eGFR African American >60 >60 mL/min      No results found.  Pertinent radiology reviewed.    Steffanie Dunn, DO

## 2019-02-10 MED ORDER — BENZONATATE 100 MG PO CAP
100 mg | Freq: Three times a day (TID) | ORAL | 0 refills | Status: DC | PRN
Start: 2019-02-10 — End: 2019-02-19
  Administered 2019-02-11 – 2019-02-18 (×5): 100 mg via ORAL

## 2019-02-10 MED ORDER — ALUMINUM-MAGNESIUM HYDROXIDE 200-200 MG/5 ML PO SUSP
30 mL | Freq: Three times a day (TID) | ORAL | 0 refills | Status: DC | PRN
Start: 2019-02-10 — End: 2019-02-19
  Administered 2019-02-11 – 2019-02-15 (×5): 30 mL via ORAL

## 2019-02-10 MED ORDER — ONDANSETRON HCL (PF) 4 MG/2 ML IJ SOLN
4 mg | INTRAVENOUS | 0 refills | Status: DC | PRN
Start: 2019-02-10 — End: 2019-02-19
  Administered 2019-02-11 – 2019-02-15 (×2): 4 mg via INTRAVENOUS

## 2019-02-10 NOTE — Progress Notes
RT Exercise Oximetry Note    Name: Charles Vaughan        MRN: [id number]          DOB: 1956/03/04          Age: 63 y.o.  Admission Date: 02/07/2019             LOS: 3 days         Date performed: 02/10/2019  Time performed: 1547  Time walked: 5 minutes    At Rest While Awake Results    Room air at rest while awake:  SpO2 = 98%      With Exercise Results:    Room air SpO2 with exercise, if needed: 94        This patient was tested on a continuous oxygen flow device.    Comments: Mr. Ritzel did not require any O2 with exercise or at rest. We were only able to walk to the hallway and back to has bed with the help of his RN due to him feeling dizzy and also he was very unbalanced.       Therapist: Quincy Carnes, RT

## 2019-02-10 NOTE — Progress Notes
General Progress Note    Name:  Charles Vaughan   Today's Date:  02/10/2019  Admission Date: 02/07/2019  LOS: 3 days                     Assessment/Plan:      46M w/ CAD s/p PCI (05/2018), HTN, HLD, polysubstance abuse (cocaine, etoh), presented to ED w/ 3d of epigastric pain, subjective fevers, dyspnea, poor PO intake, w/ weakness/ dizziness/ falls, in setting of recent COVID-19 pna (4-5wks ago; +SARS-CoV2 on 06/13 but pt asymptomatic. No indication for isolation). On arrival, noted to have hyponatremia (Na 121) w/ AG, respiratory alkalosis, and UDS positive for cocaine. Pt admitted for further mgmt.     Dizziness  Falls  - Orthostatics negative initially, but repeated 06/16 due to worsening/persistent sx  - Slowed/abnormal finger to nose in b/l UES, no nystagmus on exam, normal heel to shin, no focal deficits  - PT also evaluated at bedside  - Patient notes multiple falls at home and does not feels safe going home at this time  - He does note history of spinal surgery when he was a teenager  - 06/15 MRI head/neck: MRI brain unremarkable. 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 There is also foraminal stenosis which is limited in estimation of severity due to artifact  Plan:  - ORTHOSTATIC HYPOTENSION MGMT BELOW  - PT/OT to follow  ???  Abdominal pain- resolved  -Onset 3 days ago  -Lower abdomen radiating upwards  -7 out of 10 pain, constant  -Some improvement with morphine  -No change with food  -Lactic acid 1.7  -CT abdomen pelvis with contrast  ???????????????????????????????????????-No radiopaque gallstones, no bile ductal dilatation, unremarkable kidney and ureters, unremarkable pancreas  ???????????????????????????????????????-Reactive periportal lymph nodes  ???????????????????????????????????????-Abdominal aorta and iliac arteries normal with moderate plaque  ???????????????????????????????????????-No ascites/free air, no large or small bowel loop abnormalities ???????????????????????????????????????-Bladder is partially distended, prostate unremarkable  -CTA chest  ???????????????????????????????????????-No evidence of acute segmental pulmonary embolism  ???????????????????????????????????????-Peripheral pulmonary arteries limited by motion  ???????????????????????????????????????-Mild bronchial wall thickening, no pneumonia  -Has no obvious reason for peritonitis, lactic acid 1.7 is against ischemia  -CT abdomen pelvis done with contrast which is not ideal to evaluate abdominal aortic dissection.??????While patient is at high risk due to cocaine use it is less likely that has been ongoing for 3 days and BP has been stable throughout his ED visit  -Could be related to bladder spasms and urinary retention  Plan:  - Continue to monitor  - Foley removed and no evidence of retention thus far. HOLDING FLOMAX DUE TO SYMPTOMATIC ORTHOSTATIC HYPOTENSION  - ZOFRAN, MAALOX, FOR ABD DISCOMFORT  ???  Anion gap metabolic acidosis, resolved  Respiratory alkalosis - resolved  -Serum Osm???250,???suggesting that there are no other alcohols causing Gap  -Poor p.o. intake over the last few weeks worse over the last few days  -Suspect that anion gap is result of starvation ketosis  -Suspect that respiratory alkalosis is secondary to hyperventilation from abdominal pain???vs anxiety  -Trop neg x3  -EKG sinus tach, HR 114, twi V2, no ST changes, significant artifact  PLAN:  - Monitor BMP  ???  Hyponatremia, resolved  - Urine Osm 64, urine sodium less than 10  - Serum Osm 250,???specific gravity 1.002  - Consistent with???ADH independent cause  - Less likely psychogenic polydipsia or renal failure  - More likely a result of poor solute  intake  - Could also be related to urinary retention  PLAN:  - Monitor BMP  ???  Urinary retention  - Foley placed in ED, 700 cc out  - Foley removed 6/15 with no evidence of retention  - Continue Tamsulosin  ???  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 D/C METOPROLOL   - D/C TAMSULOSIN  ???  Alcohol use disorder   Cocaine use  - UDS positive for cocaine  - Denies any recent alcohol use  ???  COVID positive  - Reportedly tested positive for COVID-19???4 to 5 weeks ago then negative on repeat.  - Repeat testing 6/13 positive. Discussed with IPAC who notes okay to remove patient from isolation.  ???    DVT ppx: lovenox    Fluids: n/a  Electrolytes: monitor daily  Nutrition: cardiac  PT/OT: following      Dispo: Anticipate d/c to home 06/17    Total floor/unit time (reviewing and writing notes, examining the patient, reviewing test results etc) spent was 35 minutes of which > 50% was spent in care coordination and bedside counseling (explaining treatment options, disease processes, laboratory/imaging results, prognosis, risks and benefits of treatment options, medication side effects, importance of compliance with treatment, risk factor reduction, follow up with primary care physician).      Staff name:  Marcine Matar, MD Date:  02/10/2019   Med-private Team E  Team Pager [phone]  ________________________________________________________________________    Subjective  No acute event so/n. Labs stable. Noted to have +orthostatics today- checked when pt was complaining of persistent dizziness. This is new since admission. Also noted to be bradycardic. Continues to have abd discomfort (epigastric) and chest pain (middle of chest); both associated w/ dry cough. No problems w/ urination, bowels, skin changes, HA, vision changes. Has had poor PO intake due to his abd discomfort and some nausea (no emesis).     Medications  Scheduled Meds:aspirin chewable tablet 81 mg, 81 mg, Oral, QDAY  atorvastatin (LIPITOR) tablet 40 mg, 40 mg, Oral, QDAY clopiDOGrel (PLAVIX) tablet 75 mg, 75 mg, Oral, QDAY  enoxaparin (LOVENOX) syringe 30 mg, 30 mg, Subcutaneous, BID  metoprolol tartrate (LOPRESSOR) tablet 12.5 mg, 12.5 mg, Oral, QDAY  pantoprazole DR (PROTONIX) tablet 40 mg, 40 mg, Oral, QDAY(21)  QUEtiapine (SEROquel) tablet 200 mg, 200 mg, Oral, QHS  tamsulosin (FLOMAX) capsule 0.4 mg, 0.4 mg, Oral, QDAY after breakfast    Continuous Infusions:  PRN and Respiratory Meds:acetaminophen Q4H PRN, clonazePAM BID PRN, clonazePAM QHS PRN, ipratropium/albuterol PRN        Objective:                          Vital Signs: Last Filed                 Vital Signs: 24 Hour Range   BP: 113/61 (06/16 1018)  Temp: 36.3 ???C (97.3 ???F) (06/16 1018)  Pulse: 64 (06/16 1018)  Respirations: 18 PER MINUTE (06/16 1018)  SpO2: 94 % (06/16 1018)  Height: 172.7 cm (68) (06/15 1400) BP: (113-140)/(59-76)   Temp:  [36.2 ???C (97.2 ???F)-36.7 ???C (98.1 ???F)]  Pulse:  [48-67]   Respirations:  [17 PER MINUTE-18 PER MINUTE]   SpO2:  [92 %-100 %]      Vitals:    02/07/19 0003 02/07/19 0524 02/09/19 1400   Weight: 72.6 kg (160 lb) 75 kg (165 lb 4.8 oz) 76.4 kg (168 lb 6.4 oz)       Intake/Output Summary:  (Last 24 hours)    Intake/Output Summary (Last 24 hours) at 02/10/2019 1045  Last data filed at 02/10/2019 1191  Gross per 24 hour   Intake 470 ml   Output 2325 ml   Net -1855 ml      Stool Occurrence: 1      Physical exam:  General: NAD  HEENT: Normocephalic and atraumatic.  Moist Mucous membranes.  Pupils are equal, round and reactive to light. No discharge. No scleral icterus. Missing several teeth  Cardiovascular: bradycardic, regular rhythm, no murmurs.   Pulmonary/Chest: Effort normal and breath sounds normal. No respiratory distress. No wheezes or crackles  Abdominal: Soft, nondistended, nontender, and no mass. Bowel sounds are normal.  Ext:  No pitting edema in extremities. Nml distal pulses  Neurological: A&Ox3, no focal findings  Skin: Warm and dry. No rash noted. No erythema. No pallor. Lab/Imaging Reviewed  24-hour labs:    Results for orders placed or performed during the hospital encounter of 02/07/19 (from the past 24 hour(s))   CBC AND DIFF    Collection Time: 02/10/19  3:45 AM   Result Value Ref Range    White Blood Cells 5.7 4.5 - 11.0 K/UL    RBC 4.22 (L) 4.4 - 5.5 M/UL    Hemoglobin 12.0 (L) 13.5 - 16.5 GM/DL    Hematocrit 47.8 (L) 40 - 50 %    MCV 82.5 80 - 100 FL    MCH 28.4 26 - 34 PG    MCHC 34.4 32.0 - 36.0 G/DL    RDW 29.5 (H) 11 - 15 %    Platelet Count 296 150 - 400 K/UL    MPV 8.4 7 - 11 FL    Neutrophils 58 41 - 77 %    Lymphocytes 27 24 - 44 %    Monocytes 9 4 - 12 %    Eosinophils 5 0 - 5 %    Basophils 1 0 - 2 %    Absolute Neutrophil Count 3.37 1.8 - 7.0 K/UL    Absolute Lymph Count 1.54 1.0 - 4.8 K/UL    Absolute Monocyte Count 0.49 0 - 0.80 K/UL    Absolute Eosinophil Count 0.26 0 - 0.45 K/UL    Absolute Basophil Count 0.05 0 - 0.20 K/UL   COMPREHENSIVE METABOLIC PANEL    Collection Time: 02/10/19  3:45 AM   Result Value Ref Range    Sodium 140 137 - 147 MMOL/L    Potassium 4.0 3.5 - 5.1 MMOL/L    Chloride 106 98 - 110 MMOL/L    Glucose 97 70 - 100 MG/DL    Blood Urea Nitrogen 12 7 - 25 MG/DL    Creatinine 6.21 0.4 - 1.24 MG/DL    Calcium 8.7 8.5 - 30.8 MG/DL    Total Protein 5.8 (L) 6.0 - 8.0 G/DL    Total Bilirubin 0.2 (L) 0.3 - 1.2 MG/DL    Albumin 3.3 (L) 3.5 - 5.0 G/DL    Alk Phosphatase 81 25 - 110 U/L    AST (SGOT) 10 7 - 40 U/L    CO2 27 21 - 30 MMOL/L  ALT (SGPT) 8 7 - 56 U/L    Anion Gap 7 3 - 12    eGFR Non African American >60 >60 mL/min    eGFR African American >60 >60 mL/min

## 2019-02-10 NOTE — Progress Notes
PHYSICAL THERAPY  PROGRESS NOTE        Name: Charles Vaughan        MRN: [id number]          DOB: 1956/02/19          Age: 63 y.o.  Admission Date: 02/07/2019             LOS: 3 days        Mobility  Patient Turn/Position: Weight shifted (Bed)  Progressive Mobility Level: Walk in hallway  Distance Walked (feet): 100 ft  Level of Assistance: Assist X1  Assistive Device: Walker  Time Tolerated: 11-30 minutes  Activity Limited By: Fatigue;Weakness    Subjective  Significant hospital events: 63 yo M with PMH of HTN and drug use presents to ER with anxiety and abdominal pain. Patient is a poor historian. Reports epigastric abdominal pain for last 3 days. He took Oxycontin today without relief. Per EMS, patient is a known crack cocaine user. Patient admits to last using 2 days ago. Reports difficulty urinating as well as nausea. Denies vomiting, diarrhea, constipation, melena, hematochezia. Patient states headache with blurred vision as well as chest pain and shortness of breath. COVID + 6/13 although pt reports testing positive 4-5 weeks ago as well.   Mental / Cognitive Status: Alert;Cooperative;Follows Commands;Oriented;To Person;To Place;To Time  Pain: Patient has no complaint of pain  Pain Interventions: Patient agrees to participate in therapy;Patient assisted into position of comfort  Comments: Room air  Ambulation Assist: Independent Mobility in Community without Device  Patient Owned Equipment: None  Home Situation: Lives with Roommate  Type of Home: House  Entry Stairs: No Stairs  In-Home Stairs: No Stairs  Comments: Patient reports history of falls over the last 3 months although increased dizziness/falls over the last week especially. Pt reports he does not use walker. Of note, pt somewhat tangential and poor historian, initially disoriented to place although after further conversation appears more oriented.     Orthostatic vital signs   Position Heart rate Blood pressure Symptomatic (Y/N)   Supine 57 140/76 Y Seated 61 117/68 Y   Standing 64 102/61 Y     Bed Mobility/Transfer  Bed Mobility: Supine to Sit: Standby Assist;Bed Flat  Bed Mobility: Sit to Supine: Standby Assist;Bed Flat;Use of Rail  Transfer Type: Sit to/from Stand  Transfer: Assistance Level: To/From;Bed;Standby Assist  Transfer: Assistive Device: Nurse, adult  Transfers: Type Of Assistance: Materials engineer;For Safety Considerations  End Of Activity Status: Nursing Notified;Instructed Patient to Request Assist with Mobility;Instructed Patient to Use Call Light;In Bed    Balance  Sitting Balance: Static Sitting Balance;Independent  Standing Balance: Dynamic Standing Balance;2 UE support;Minimal Assist    Gait  Gait Distance: 100 feet  Gait: Assistance Level: Minimal Assist;Safety Considerations(3 large LOB with maximum assistance to recover)  Gait: Assistive Device: Roller Walker  Gait: Descriptors: Pace: Normal;Decreased foot clearance RLE;Decreased foot clearance LLE;Loss of balance;Decreased step length;Variable step length;Pathway deviations;Scissoring  Activity Limited By: Complaint of Fatigue;SOA    Education  Persons Educated: Patient  Patient Barriers To Learning: Confusion(Mild)  Interventions: Repetition of Instructions  Teaching Methods: Verbal Instruction  Patient Response: Verbalized Understanding  Topics: Plan/Goals of PT Interventions;Use of Assistive Device/Orthosis;Mobility Progression;Safety Awareness;Up with Assist Only;Importance of Increasing Activity;Ambulate With Nursing;Equipment Recommendations;Recommend Continued Therapy  Comments: Recommending use of walker when leaving hospital    Assessment/Progress  Impaired Mobility Due To: Impaired Balance;Decreased Activity Tolerance  Assessment/Progress: Should Improve w/ Continued PT     Comments: Patient presents with  no issues with activity tolerance but does demonstrate 3 large LOB with maximum assistance to recover. loss of balance not consistent with stability at beginning of therapy treatment. Patient complains of dizziness through out therapy session but variable in description. Patient presents with several inconsistency during therapy session and origin unknown. Patient comments that when hes dizzy likes this he just cant function.     AM-PAC 6 Clicks Basic Mobility Inpatient  Turning from your back to your side while in a flat bed without using bed rails: None  Moving from lying on your back to sitting on the side of a flatbed without using bedrails : None  Moving to and from a bed to a chair (including a wheelchair): A Little  Standing up from a chair using your arms (e.g. wheelchair, or bedside chair): A Little  To walk in hospital room: A Little  Climbing 3-5 steps with a railing: A Little  Raw Score: 20  Standardized (T-scale) Score: 43.99  Basic Mobility CMS 0-100%: 33.32  CMS G Code Modifier for Basic Mobility: CJ    Goals  Goal Formulation: With Patient  Time For Goal Achievement: 5 days  Patient Will Go Supine To/From Sit: Independently  Patient Will Transfer Bed/Chair: Independently  Patient Will Ambulate: Greater than 200 Feet, w/ Walker, w/ Stand By Assist    Plan  Treatment Interventions: Mobility Training;Balance Activities;Endurance Training  Plan Frequency: 5 Days per Week  PT Plan for Next Visit: formal balance assessment, increase stability during ambuation.     PT Discharge Recommendations  Recommendation: Home with consistent supervision/assistance;versus; Inpatient setting   Anticipate patient will progress well and be safe for home discharge. Currently limited by: decreased safety awareness, instability, and increased risk for falls. Will continue to assess and update recommendations.      Due to patients variable cognition/ safety awareness, patient requires consistent supervision/ physical assistance to maintain standing balance. No family or friends present to verify patient cognition baseline compared to current situation.     Patient Currently Requires Supervision For: Making decisions about safety;Mobility  Patient Currently Requires Equipment: Walker with wheels   Patient requires the use of a walker with wheels to complete ADL???s in the home including meal preparation, ambulation the bathroom for toileting, bathing and grooming, and safe home mobility.  Patient is unable to complete these ADL???s with a cane or crutch and can safely use the walker.       Therapist: Romana Juniper, PT  Date: 02/10/2019

## 2019-02-10 NOTE — Care Plan
Problem: Infection, Risk of  Goal: Absence of infection  Outcome: Goal Ongoing  Goal: Knowledge of Infection Control Procedures  Outcome: Goal Ongoing     Problem: Falls, High Risk of  Goal: Absence of falls-Adult Patient  Outcome: Goal Ongoing  Goal: Absence of Falls-Pediatric patient  Outcome: Goal Ongoing     Problem: Mobility/Activity Intolerance  Goal: Maximize functional ADL's and mobility outcomes  Outcome: Goal Ongoing

## 2019-02-11 NOTE — Progress Notes
RT Adult Assessment Note    NAME:Charles Vaughan             MRN: [id number]             DOB:[birth year]          AGE: 63 y.o.  ADMISSION DATE: 02/07/2019             DAYS ADMITTED: LOS: 4 days    RT Treatment Plan:  Protocol Plan: Medications  Albuterol: MDI PRN  Ipratropium: MDI PRN    Protocol Plan: Procedures  Comment: Combivent PRN    Additional Comments:  Impressions of the patient: Pt resting comfortably, alert and showing NAD.   Intervention(s)/outcome(s): No PRN indicated at this time.  Patient education that was completed: None.   Recommendations to the care team: Continue current plan.     Vital Signs:  Pulse: 82  RR: 16 PER MINUTE  SpO2: 97 %  O2%: 21 %  Breath Sounds:  Clear, decreased  Respiratory Effort: Non-Labored

## 2019-02-11 NOTE — Progress Notes
General Progress Note    Name:  Charles Vaughan   Today's Date:  02/11/2019  Admission Date: 02/07/2019  LOS: 4 days                     Assessment/Plan:      5M w/ CAD s/p PCI (05/2018), HTN, HLD, polysubstance abuse (cocaine, etoh), presented to ED w/ 3d of epigastric pain, subjective fevers, dyspnea, poor PO intake, w/ weakness/ dizziness/ falls, in setting of recent COVID-19 pna (4-5wks ago; +SARS-CoV2 on 06/13 but pt asymptomatic. No indication for isolation). On arrival, noted to have hyponatremia (Na 121) w/ AG, respiratory alkalosis, and UDS positive for cocaine. Pt admitted for further mgmt.     Dizziness  Falls  - Orthostatics negative initially, but repeated 06/16 due to worsening/persistent sx  - Slowed/abnormal finger to nose in b/l UES, no nystagmus on exam, normal heel to shin, no focal deficits  - PT also evaluated at bedside  - Patient notes multiple falls at home and does not feels safe going home at this time  - He does note history of spinal surgery when he was a teenager  - 06/15 MRI head/neck: MRI brain unremarkable. 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 There is also foraminal stenosis which is limited in estimation of severity due to artifact  Plan:  - orhtostatic hypotension mgmt below  - PT/OT to follow  ???  Abdominal pain- resolved  -Onset 3 days ago  -Lower abdomen radiating upwards  -7 out of 10 pain, constant  -Some improvement with morphine  -No change with food  -Lactic acid 1.7  -CT abdomen pelvis with contrast  ???????????????????????????????????????-No radiopaque gallstones, no bile ductal dilatation, unremarkable kidney and ureters, unremarkable pancreas  ???????????????????????????????????????-Reactive periportal lymph nodes  ???????????????????????????????????????-Abdominal aorta and iliac arteries normal with moderate plaque  ???????????????????????????????????????-No ascites/free air, no large or small bowel loop abnormalities ???????????????????????????????????????-Bladder is partially distended, prostate unremarkable  -CTA chest  ???????????????????????????????????????-No evidence of acute segmental pulmonary embolism  ???????????????????????????????????????-Peripheral pulmonary arteries limited by motion  ???????????????????????????????????????-Mild bronchial wall thickening, no pneumonia  -Has no obvious reason for peritonitis, lactic acid 1.7 is against ischemia  -CT abdomen pelvis done with contrast which is not ideal to evaluate abdominal aortic dissection.??????While patient is at high risk due to cocaine use it is less likely that has been ongoing for 3 days and BP has been stable throughout his ED visit  -Could be related to bladder spasms and urinary retention  Plan:  - Continue to monitor  - Foley removed and no evidence of retention thus far. Had been started on flomax for retention ,howver, stopped on 06/16 due to symptomatic orthostatic hypotension  - zofran, maalox for abd discomfort  ???  Anion gap metabolic acidosis, resolved  Respiratory alkalosis - resolved  -Serum Osm???250,???suggesting that there are no other alcohols causing Gap  -Poor p.o. intake over the last few weeks worse over the last few days  -Suspect that anion gap is result of starvation ketosis  -Suspect that respiratory alkalosis is secondary to hyperventilation from abdominal pain???vs anxiety  -Trop neg x3  -EKG sinus tach, HR 114, twi V2, no ST changes, significant artifact  Plan:  - Monitor BMP  ???  Hyponatremia, resolved  - Urine Osm 64, urine sodium less than 10  - Serum Osm 250,???specific gravity 1.002  - Consistent with???ADH independent cause  - Less likely psychogenic polydipsia or renal failure  -  More likely a result of poor solute intake  - Could also be related to urinary retention  PLAN:  - Monitor BMP  ???  Urinary retention  - Foley placed in ED, 700 cc out  - Foley removed 6/15 with no evidence of retention  - Continue Tamsulosin  ???  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 D/C METOPROLOL   - D/C TAMSULOSIN  ???  Alcohol use disorder   Cocaine use  - UDS positive for cocaine  - Denies any recent alcohol use  ???  COVID positive  - Reportedly tested positive for COVID-19???4 to 5 weeks ago then negative on repeat.  - Repeat testing 6/13 positive. Discussed with IPAC who notes okay to remove patient from isolation.  ???    DVT ppx: lovenox    Fluids: n/a  Electrolytes: monitor daily  Nutrition: cardiac  PT/OT: following      Dispo: pending pt/ot recs (not sure if pt will be safe to go home given his weakness and unsteadiness)       Staff name:  Marcine Matar, MD Date:  02/11/2019   Med-private Team E  Team Pager [phone]  ________________________________________________________________________    Subjective  No acute events o/n. V/s stable (HR improved after d/cing metoprolol; BP OK). Labs stable. Pt states that he feels 'sick.' describes this as generalized malaise and weakness. Is feeling very unsteady on feet. Getting very dizzy w/ movement. No HA or vision changes. No chest pain, dyspnea, cough, n/v. No abd discomfort today.     Medications  Scheduled Meds:aspirin chewable tablet 81 mg, 81 mg, Oral, QDAY  atorvastatin (LIPITOR) tablet 40 mg, 40 mg, Oral, QDAY  clopiDOGrel (PLAVIX) tablet 75 mg, 75 mg, Oral, QDAY  enoxaparin (LOVENOX) syringe 30 mg, 30 mg, Subcutaneous, BID  pantoprazole DR (PROTONIX) tablet 40 mg, 40 mg, Oral, QDAY(21)  QUEtiapine (SEROquel) tablet 200 mg, 200 mg, Oral, QHS    Continuous Infusions:  PRN and Respiratory Meds:acetaminophen Q4H PRN, aluminum/magnesium hydroxide TID w/ meals PRN, benzonatate TID PRN, clonazePAM BID PRN, clonazePAM QHS PRN, ipratropium/albuterol PRN, ondansetron (ZOFRAN) IV Q6H PRN        Objective:                          Vital Signs: Last Filed                 Vital Signs: 24 Hour Range   BP: 110/50 (06/17 0550)  Temp: 36.5 ???C (97.7 ???F) (06/17 0550)  Pulse: 66 (06/17 0700)  Respirations: 16 PER MINUTE (06/17 0550)  SpO2: 96 % (06/17 0550) BP: (99-132)/(50-74)   Temp:  [36.3 ???C (97.3 ???F)-36.7 ???C (98.1 ???F)]   Pulse:  [50-69]   Respirations:  [16 PER MINUTE-18 PER MINUTE]   SpO2:  [92 %-98 %]    Intensity Pain Scale (Self Report): 5 (02/11/19 0600) Vitals:    02/07/19 0003 02/07/19 0524 02/09/19 1400   Weight: 72.6 kg (160 lb) 75 kg (165 lb 4.8 oz) 76.4 kg (168 lb 6.4 oz)       Intake/Output Summary:  (Last 24 hours)    Intake/Output Summary (  Last 24 hours) at 02/11/2019 0843  Last data filed at 02/11/2019 1610  Gross per 24 hour   Intake 1634 ml   Output 1450 ml   Net 184 ml      Stool Occurrence: 0      Physical exam:  General: NAD  HEENT: Normocephalic and atraumatic.  Moist Mucous membranes.  Pupils are equal, round and reactive to light. No discharge. No scleral icterus. Missing several teeth  Cardiovascular: bradycardic, regular rhythm, no murmurs.   Pulmonary/Chest: Effort normal and breath sounds normal. No respiratory distress. No wheezes or crackles  Abdominal: Soft, nondistended, nontender, and no mass. Bowel sounds are normal.  Ext:  No pitting edema in extremities. Nml distal pulses  Neurological: A&Ox3, no focal findings  Skin: Warm and dry. No rash noted. No erythema. No pallor.    Lab/Imaging Reviewed  24-hour labs:    Results for orders placed or performed during the hospital encounter of 02/07/19 (from the past 24 hour(s))   CBC AND DIFF    Collection Time: 02/11/19  5:52 AM   Result Value Ref Range    White Blood Cells 4.2 (L) 4.5 - 11.0 K/UL    RBC 4.15 (L) 4.4 - 5.5 M/UL    Hemoglobin 11.9 (L) 13.5 - 16.5 GM/DL    Hematocrit 96.0 (L) 40 - 50 %    MCV 82.9 80 - 100 FL    MCH 28.7 26 - 34 PG MCHC 34.6 32.0 - 36.0 G/DL    RDW 45.4 (H) 11 - 15 %    Platelet Count 293 150 - 400 K/UL    MPV 8.6 7 - 11 FL    Neutrophils 52 41 - 77 %    Lymphocytes 33 24 - 44 %    Monocytes 8 4 - 12 %    Eosinophils 6 (H) 0 - 5 %    Basophils 1 0 - 2 %    Absolute Neutrophil Count 2.21 1.8 - 7.0 K/UL    Absolute Lymph Count 1.37 1.0 - 4.8 K/UL    Absolute Monocyte Count 0.32 0 - 0.80 K/UL    Absolute Eosinophil Count 0.23 0 - 0.45 K/UL    Absolute Basophil Count 0.05 0 - 0.20 K/UL   COMPREHENSIVE METABOLIC PANEL    Collection Time: 02/11/19  5:52 AM   Result Value Ref Range    Sodium 142 137 - 147 MMOL/L    Potassium 4.1 3.5 - 5.1 MMOL/L    Chloride 107 98 - 110 MMOL/L    Glucose 82 70 - 100 MG/DL    Blood Urea Nitrogen 13 7 - 25 MG/DL    Creatinine 0.98 0.4 - 1.24 MG/DL    Calcium 8.9 8.5 - 11.9 MG/DL    Total Protein 5.7 (L) 6.0 - 8.0 G/DL    Total Bilirubin 0.2 (L) 0.3 - 1.2 MG/DL    Albumin 3.4 (L) 3.5 - 5.0 G/DL    Alk Phosphatase 73 25 - 110 U/L    AST (SGOT) 11 7 - 40 U/L    CO2 30 21 - 30 MMOL/L    ALT (SGPT) 10 7 - 56 U/L    Anion Gap 5 3 - 12    eGFR Non African American >60 >60 mL/min    eGFR African American >60 >60 mL/min

## 2019-02-11 NOTE — Care Plan
Problem: Infection, Risk of  Goal: Absence of infection  Outcome: Goal Ongoing  Flowsheets (Taken 02/11/2019 0248)  Absence of infection:   Assess for infection (Monitor SIRS Criteria)   Implement prevention measures as indicated   Monitor for signs and symptoms of infection   Administer pharmacological therapies as ordered  Goal: Knowledge of Infection Control Procedures  Outcome: Goal Ongoing     Problem: Falls, High Risk of  Goal: Absence of falls-Adult Patient  Outcome: Goal Ongoing  Flowsheets (Taken 02/11/2019 0248)  Absence of falls-Adult Patient:   Complete Fall Risk Assessment.   Consider additional interventions if patient is confused, has gait/balance problems and on high risk medications.   Provide fall prevention strategies.   Provide safe ambulation.   Implement fall risk bundle.   Provde safe environment.     Problem: Mobility/Activity Intolerance  Goal: Maximize functional ADL's and mobility outcomes  Outcome: Goal Ongoing  Flowsheets (Taken 02/11/2019 0248)  Maximize functional ADLs and mobility outcomes:   Maintain body position   Manage environmental safety   Manage energy conservation for mobility/activity intolerance   Consider consult for mobility/activity intolerance     Problem: Pain  Goal: Management of pain  Outcome: Goal Ongoing  Flowsheets (Taken 02/11/2019 0248)  Management of pain:   Complete pain assessment scale according to age, condition and ability to understand.   In the patient who can fully report pain, assess pain characteristics.   Manage pain.  Goal: Knowledge of pain management  Outcome: Goal Ongoing  Flowsheets (Taken 02/11/2019 0248)  Knowledge of pain management:   Provide pain scale education   Provide pain management methods education   Provide pharmacological pain management education  Goal: Progress Toward Pain Management Goals  Outcome: Goal Ongoing  Flowsheets (Taken 02/11/2019 0248)  Progress toward pain management goals:   Progress toward pain management goals Assess progress toward pain management goals

## 2019-02-11 NOTE — Case Management (ED)
Case Management Progress Note    NAME:Charles Vaughan                          MRN: [id number]              DOB:1956/03/15          AGE: 63 y.o.  ADMISSION DATE: 02/07/2019             DAYS ADMITTED: LOS: 4 days      Todays Date: 02/11/2019    Plan  Patient plans to discharge home when medically stable  w RW    Interventions  ? Support         NCM will attended huddle.  NCM reviewed EMR, labs, MAR and Notes.  No ongoing therapy noted.  NNCM will continue to assess and monitor for changes    ? Info or Referral      ? Discharge Planning         Pt will need roller walker before dc  NCm sent referral per pt to [facility] [phone], [phone] contacted [facility] and they will deliver RW today  Pt ok to dc once RW is delivered from NCM stand point  Patient has no other need identified at this time  NCM will follow through discharge if any other needs arise.           ? Medication Needs      ? Financial      ? Legal      ? Other        Disposition  ? Expected Discharge Date    Expected Discharge Date: 02/11/19  Expected Discharge Time: 1500  ? Transportation   Does the patient need discharge transport arranged?: No  Transportation Name, Phone and Availability #1: One of pts roommates  Transportation Name, Phone and Availability #2: cab pass  Does the patient use Medicaid Transportation?: No  ? Next Level of Care (Acute Psych discharges only)      ? Discharge Disposition                                          Durable Medical Equipment      No service has been selected for the patient.      [facility]      No service has been selected for the patient.      [facility]      No service has been selected for the patient.      [facility]      No service has been selected for the patient.

## 2019-02-12 NOTE — Progress Notes
General Progress Note    Name:  Charles Vaughan   Today's Date:  02/12/2019  Admission Date: 02/07/2019  LOS: 5 days                     Assessment/Plan:      60M w/ CAD s/p PCI (05/2018), HTN, HLD, polysubstance abuse (cocaine, etoh), presented to ED w/ 3d of epigastric pain, subjective fevers, dyspnea, poor PO intake, w/ weakness/ dizziness/ falls, in setting of recent COVID-19 pna (4-5wks ago; +SARS-CoV2 on 06/13 but pt asymptomatic. No indication for isolation). On arrival, noted to have hyponatremia (Na 121) w/ AG, respiratory alkalosis, and UDS positive for cocaine. Pt admitted for further mgmt.     Dizziness  Falls  - Orthostatics negative initially, but repeated 06/16 due to worsening/persistent sx  - Slowed/abnormal finger to nose in b/l UES, no nystagmus on exam, normal heel to shin, no focal deficits  - PT also evaluated at bedside  - Patient notes multiple falls at home and does not feels safe going home at this time  - He does note history of spinal surgery when he was a teenager  - 06/15 MRI head/neck: MRI brain unremarkable. 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 There is also foraminal stenosis which is limited in estimation of severity due to artifact  Plan:  - orhtostatic hypotension mgmt below  - PT/OT to follow  - GETTING ECHO, CONTINUE ON TELE  ???  Abdominal pain- resolved  -Onset 3 days ago  -Lower abdomen radiating upwards  -7 out of 10 pain, constant  -Some improvement with morphine  -No change with food  -Lactic acid 1.7  -CT abdomen pelvis with contrast  ???????????????????????????????????????-No radiopaque gallstones, no bile ductal dilatation, unremarkable kidney and ureters, unremarkable pancreas  ???????????????????????????????????????-Reactive periportal lymph nodes  ???????????????????????????????????????-Abdominal aorta and iliac arteries normal with moderate plaque  ???????????????????????????????????????-No ascites/free air, no large or small bowel loop abnormalities ???????????????????????????????????????-Bladder is partially distended, prostate unremarkable  -CTA chest  ???????????????????????????????????????-No evidence of acute segmental pulmonary embolism  ???????????????????????????????????????-Peripheral pulmonary arteries limited by motion  ???????????????????????????????????????-Mild bronchial wall thickening, no pneumonia  -Has no obvious reason for peritonitis, lactic acid 1.7 is against ischemia  -CT abdomen pelvis done with contrast which is not ideal to evaluate abdominal aortic dissection.??????While patient is at high risk due to cocaine use it is less likely that has been ongoing for 3 days and BP has been stable throughout his ED visit  -Could be related to bladder spasms and urinary retention  Plan:  - Continue to monitor  - Foley removed and no evidence of retention thus far. Had been started on flomax for retention ,howver, stopped on 06/16 due to symptomatic orthostatic hypotension  - zofran, maalox for abd discomfort  ???  Anion gap metabolic acidosis, resolved  Respiratory alkalosis - resolved  -Serum Osm???250,???suggesting that there are no other alcohols causing Gap  -Poor p.o. intake over the last few weeks worse over the last few days  -Suspect that anion gap is result of starvation ketosis  -Suspect that respiratory alkalosis is secondary to hyperventilation from abdominal pain???vs anxiety  -Trop neg x3  -EKG sinus tach, HR 114, twi V2, no ST changes, significant artifact  Plan:  - Monitor BMP  ???  Hyponatremia, resolved  - Urine Osm 64, urine sodium less than 10  - Serum Osm 250,???specific gravity 1.002  - Consistent with???ADH independent cause  -  Less likely psychogenic polydipsia or renal failure  - More likely a result of poor solute intake  - Could also be related to urinary retention  PLAN:  - Monitor BMP  ???  Urinary retention  - Foley placed in ED, 700 cc out  - Foley removed 6/15 with no evidence of retention  - Continue Tamsulosin  ???  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 DOING WELL SINCE STOPPING METOPROLOL  - D/C TAMSULOSIN  ???  Alcohol use disorder   Cocaine use  - UDS positive for cocaine  - Denies any recent alcohol use  ???  COVID positive  - Reportedly tested positive for COVID-19???4 to 5 weeks ago then negative on repeat.  - Repeat testing 6/13 positive. Discussed with IPAC who notes okay to remove patient from isolation.  ???    DVT ppx: lovenox    Fluids: n/a  Electrolytes: monitor daily  Nutrition: cardiac  PT/OT: following      Dispo: pending pt/ot recs (not sure if pt will be safe to go home given his weakness and unsteadiness)       Staff name:  Marcine Matar, MD Date:  02/12/2019   Med-private Team E  Team Pager [phone]  ________________________________________________________________________    Subjective  No acute events o/n. V/s and labs ok. Still having a lot of trouble w/ dizziness. Still getting dizzy after any sort of movement. No change in any other sx.     Medications  Scheduled Meds:aspirin chewable tablet 81 mg, 81 mg, Oral, QDAY  atorvastatin (LIPITOR) tablet 40 mg, 40 mg, Oral, QDAY  clopiDOGrel (PLAVIX) tablet 75 mg, 75 mg, Oral, QDAY  enoxaparin (LOVENOX) syringe 30 mg, 30 mg, Subcutaneous, BID  pantoprazole DR (PROTONIX) tablet 40 mg, 40 mg, Oral, QDAY(21)  QUEtiapine (SEROquel) tablet 200 mg, 200 mg, Oral, QHS    Continuous Infusions:  PRN and Respiratory Meds:acetaminophen Q4H PRN, aluminum/magnesium hydroxide TID w/ meals PRN, benzonatate TID PRN, clonazePAM BID PRN, clonazePAM QHS PRN, ipratropium/albuterol PRN, ondansetron (ZOFRAN) IV Q6H PRN        Objective: Vital Signs: Last Filed                 Vital Signs: 24 Hour Range   BP: 122/60 (06/18 0511)  Temp: 36.8 ???C (98.3 ???F) (06/18 9604)  Pulse: 61 (06/18 0511)  Respirations: 18 PER MINUTE (06/18 0511)  SpO2: 95 % (06/18 0511) BP: (94-135)/(44-70)   Temp:  [36.4 ???C (97.5 ???F)-36.8 ???C (98.3 ???F)]   Pulse:  [51-82]   Respirations:  [16 PER MINUTE-18 PER MINUTE]   SpO2:  [94 %-97 %]      Vitals:    02/07/19 0003 02/07/19 0524 02/09/19 1400   Weight: 72.6 kg (160 lb) 75 kg (165 lb 4.8 oz) 76.4 kg (168 lb 6.4 oz)       Intake/Output Summary:  (Last 24 hours)    Intake/Output Summary (Last 24 hours) at 02/12/2019 0904  Last data filed at 02/12/2019 0830  Gross per 24 hour   Intake 1202 ml   Output 2325 ml   Net -1123 ml      Stool  Occurrence: 0      Physical exam:  General: NAD  HEENT: Normocephalic and atraumatic.  Moist Mucous membranes.  Pupils are equal, round and reactive to light. No discharge. No scleral icterus. Missing several teeth  Cardiovascular: bradycardic, regular rhythm, no murmurs.   Pulmonary/Chest: Effort normal and breath sounds normal. No respiratory distress. No wheezes or crackles  Abdominal: Soft, nondistended, nontender, and no mass. Bowel sounds are normal.  Ext:  No pitting edema in extremities. Nml distal pulses  Neurological: A&Ox3, no focal findings  Skin: Warm and dry. No rash noted. No erythema. No pallor.    Lab/Imaging Reviewed  24-hour labs:    Results for orders placed or performed during the hospital encounter of 02/07/19 (from the past 24 hour(s))   CBC AND DIFF    Collection Time: 02/12/19  3:58 AM   Result Value Ref Range    White Blood Cells 5.9 4.5 - 11.0 K/UL    RBC 4.45 4.4 - 5.5 M/UL    Hemoglobin 12.4 (L) 13.5 - 16.5 GM/DL    Hematocrit 45.4 (L) 40 - 50 %    MCV 82.6 80 - 100 FL    MCH 27.9 26 - 34 PG    MCHC 33.8 32.0 - 36.0 G/DL    RDW 09.8 (H) 11 - 15 %    Platelet Count 313 150 - 400 K/UL    MPV 8.8 7 - 11 FL    Neutrophils 58 41 - 77 % Lymphocytes 30 24 - 44 %    Monocytes 6 4 - 12 %    Eosinophils 5 0 - 5 %    Basophils 1 0 - 2 %    Absolute Neutrophil Count 3.48 1.8 - 7.0 K/UL    Absolute Lymph Count 1.77 1.0 - 4.8 K/UL    Absolute Monocyte Count 0.34 0 - 0.80 K/UL    Absolute Eosinophil Count 0.27 0 - 0.45 K/UL    Absolute Basophil Count 0.05 0 - 0.20 K/UL   COMPREHENSIVE METABOLIC PANEL    Collection Time: 02/12/19  3:58 AM   Result Value Ref Range    Sodium 141 137 - 147 MMOL/L    Potassium 3.7 3.5 - 5.1 MMOL/L    Chloride 106 98 - 110 MMOL/L    Glucose 151 (H) 70 - 100 MG/DL    Blood Urea Nitrogen 14 7 - 25 MG/DL    Creatinine 1.19 0.4 - 1.24 MG/DL    Calcium 9.2 8.5 - 14.7 MG/DL    Total Protein 6.1 6.0 - 8.0 G/DL    Total Bilirubin 0.2 (L) 0.3 - 1.2 MG/DL    Albumin 3.6 3.5 - 5.0 G/DL    Alk Phosphatase 74 25 - 110 U/L    AST (SGOT) 14 7 - 40 U/L    CO2 25 21 - 30 MMOL/L    ALT (SGPT) 14 7 - 56 U/L    Anion Gap 10 3 - 12    eGFR Non African American >60 >60 mL/min    eGFR African American >60 >60 mL/min

## 2019-02-12 NOTE — Care Plan
Problem: Infection, Risk of  Goal: Absence of infection  Outcome: Goal Ongoing  Goal: Knowledge of Infection Control Procedures  Outcome: Goal Ongoing     Problem: Falls, High Risk of  Goal: Absence of falls-Adult Patient  Outcome: Goal Ongoing     Problem: Mobility/Activity Intolerance  Goal: Maximize functional ADL's and mobility outcomes  Outcome: Goal Ongoing     Problem: Pain  Goal: Management of pain  Outcome: Goal Ongoing  Goal: Knowledge of pain management  Outcome: Goal Ongoing  Goal: Progress Toward Pain Management Goals  Outcome: Goal Ongoing     Problem: Skin Integrity  Goal: Skin integrity intact  Outcome: Goal Ongoing  Flowsheets (Taken 02/12/2019 0936)  Skin integrity intact:   Assess nutrition   Promote nutrition   Assure position change   Monitor skin integrity   Reduce skin shear, friction and tissue load   Provide skin care interventions   Provide incontinence management interventions   Provide skin self-assessment education  Goal: Healing of skin (Pressure Injury)  Outcome: Goal Ongoing  Flowsheets (Taken 02/12/2019 0936)  Healing of skin (Pressure Injury):   Assess for signs and symptoms of pressure injury infection   Use the National Pressure Injury Advisory Panel Staging System for grading pressure injuries   Assess pressure injury   Implement pressure injury care as ordered   Implement pressure relieving device/specialty mattress   Promote ambulation   Provide pressure injury care education as ordered     Problem: Anxiety  Goal: Alleviation of anxiety  Outcome: Goal Ongoing  Flowsheets (Taken 02/12/2019 0936)  Alleviation of Anxiety:   Assess coping style   Provide coping support to patient/caregiver   Provide comfort promotion interventions   Provide positive coping methods with education   Provide relaxation techniques education     Problem: Respiratory Impairment (Non-Ventilated Patient)  Goal: Effective gas exchange  Outcome: Goal Ongoing  Flowsheets (Taken 02/12/2019 0936) Effective Gas Exchange:   Promote incentive spirometry   Provide lung expansion therapy   Assess oxygen/gas administration   Monitor pulse oximetry  Goal: Effective breathing pattern  Outcome: Goal Ongoing  Flowsheets (Taken 02/12/2019 0936)  Effective breathing pattern:   Manage respiratory status   Provide position change   Administer pharmacological therapies as ordered  Goal: Patent airway  Outcome: Goal Ongoing  Flowsheets (Taken 02/12/2019 0936)  Patent airway: Maintain patent airway

## 2019-02-13 DIAGNOSIS — R109 Unspecified abdominal pain: Principal | ICD-10-CM

## 2019-02-13 MED ORDER — MULTIVIT-IRON-FA-CALCIUM-MINS 9 MG IRON-400 MCG PO TAB
1 | Freq: Every day | ORAL | 0 refills | Status: DC
Start: 2019-02-13 — End: 2019-02-19
  Administered 2019-02-14 – 2019-02-19 (×7): 1 via ORAL

## 2019-02-13 MED ORDER — PERFLUTREN LIPID MICROSPHERES 1.1 MG/ML IV SUSP
1-20 mL | Freq: Once | INTRAVENOUS | 0 refills | Status: CP | PRN
Start: 2019-02-13 — End: ?
  Administered 2019-02-13: 18:00:00 2 mL via INTRAVENOUS

## 2019-02-13 MED ORDER — ASCORBIC ACID (VITAMIN C) 500 MG PO TAB
250 mg | Freq: Every day | ORAL | 0 refills | Status: DC
Start: 2019-02-13 — End: 2019-02-19
  Administered 2019-02-14 – 2019-02-19 (×7): 250 mg via ORAL

## 2019-02-13 NOTE — Care Plan
Problem: Infection, Risk of  Goal: Absence of infection  Outcome: Goal Ongoing  Goal: Knowledge of Infection Control Procedures  Outcome: Goal Ongoing     Problem: Falls, High Risk of  Goal: Absence of falls-Adult Patient  Outcome: Goal Ongoing  Note: High fall risk bundle implemented.       Problem: Mobility/Activity Intolerance  Goal: Maximize functional ADL's and mobility outcomes  Outcome: Goal Ongoing     Problem: Pain  Goal: Management of pain  Outcome: Goal Ongoing  Note: Pt's pain level is routinely being assessed.    Goal: Knowledge of pain management  Outcome: Goal Ongoing  Goal: Progress Toward Pain Management Goals  Outcome: Goal Ongoing     Problem: Skin Integrity  Goal: Skin integrity intact  Outcome: Goal Ongoing  Goal: Healing of skin (Pressure Injury)  Outcome: Goal Ongoing     Problem: Anxiety  Goal: Alleviation of anxiety  Outcome: Goal Ongoing     Problem: Respiratory Impairment (Non-Ventilated Patient)  Goal: Effective gas exchange  Outcome: Goal Ongoing  Goal: Effective breathing pattern  Outcome: Goal Ongoing  Goal: Patent airway  Outcome: Goal Ongoing

## 2019-02-13 NOTE — Progress Notes
CLINICAL NUTRITION                                                        Clinical Nutrition Initial Assessment    Name: Charles Vaughan        MRN: [id number]          DOB: 1956/02/28          Age: 63 y.o.  Admission Date: 02/07/2019             LOS: 6 days        Recommendation:  Continue current diet as ordered. Encourage patient to consume 3 meals per day with nutrition supplements/high calorie high protein snacks between meals.   ??? REC to check 25-OH vit D at some point to r/o deficiency; replace if <30ng/mL.     Lab Results   Component Value Date    SERFOLATE >22.3 10/06/2018    VITB12 473 10/06/2018     Comments:  19M w/ CAD s/p PCI (05/2018), HTN, HLD, polysubstance abuse (cocaine, etoh), presented to ED w/ 3d of epigastric pain, subjective fevers, dyspnea, poor PO intake, w/ weakness/ dizziness/ falls, in setting of recent COVID-19 pna (4-5wks ago; +SARS-CoV2 on 06/13 but pt asymptomatic. No indication for isolation). On arrival, noted to have hyponatremia (Na 121) w/ AG, respiratory alkalosis, and UDS positive for cocaine. Pt admitted for further mgmt. Spoke w/ pt 6/18, he reported good PO intake/appetite, reports the menu has a good variety of options, he's been consuming eggs, toast, potatoes, yogurt and boost plus for breakfast. Had grilled meatloaf, mashed cauliflower for lunch. He reported some nausea but he has been struggling with this since he was admitted. He denied other GI issues. Reports he does like the boost, so if his appetite does change he will drink them, currently ordered w/ breakfast.     Noted as of this AM pressure injury stage 1 now a stage 2 pressure injury. Wound team consulted. We discussed wound healing, importance of consuming a protein source at each meal & nutrition supplements during times of low appetite to increase protein intake. Pt was agreeable and open to vitamin/mineral supplementation while in patient. Handouts provided at bedside regarding pressure injury nutrition therapy. He had no other questions for this RD, though did state that he did not get his boost plus w/ breakfast this AM, updated order. Clinical nutrition will continue to monitor per protocol.     Nutrition Assessment of Patient:  Admit Weight: 72.6 kg;  ; Desired Weight: 74.2 kg  BMI (Calculated): 25.6; BMI Categories Adult: Over Weight: 25-29.9(BMI 25.6); Appearance: Appropriate  Pertinent Allergies/Intolerances: None  Pertinent Labs: Na 140; K+ 4.7 (slt hemolysis), Cl 104, Co2 28, BUN/Creat WNL, Ca WNL. LFTs WNL. ; Pertinent Meds: PPI, tessalon, zofran, ;    Oral Diet Order: Cardiac; Oral Supplement: Boost plus     Current Oral Intake: Adequate           Estimated Calorie Needs: 1855-2225(25-30 kcal/kg DBW)  Estimated Protein Needs: 89-97(1.2-1.3 g/kg DBW)    Malnutrition Assessment:   Does not meet criteria         Nutrition Focused Physical Assessment:   Edema: No;  ;       Pressure Injury: stage 2 coccyx     Comment: s/nd/nt abd per RN, +BM 6/17  Nutrition Diagnosis:  Increased nutrient needs, specify:(kcal/protein)  Etiology: demands for wound healing  Signs & Symptoms: stage 2 pressure injury                      Intervention / Plan:  Encouraged PO intake, ordered nutrition supplementation and vitamin/minerals for wound healing  Monitor wt, labs, meds, Gi symptoms, PO intake       Goals:  Prevent further skin breakdown  Time Frame: Throughout stay  Patient to consume >75% of meals/supplements  Time Frame: Within 5 days           Konrad Saha, MS, RD, LD, CNSC    [place]: [phone] (Preferred Communication Method)  Pager: [phone]*

## 2019-02-13 NOTE — Care Plan
Pt. Is actively involved in plan of care.  Pt. Pain has been managed by medications or other therapeutic means.   Pt. Understands the different pain management routes available.   Fall bundle in place; Bed alarm on; Education provided  Pt. Has been turned every two hours.  Wound team consulted.  Pt. Has eaten well today.   Pt. Anxiety has been managed by prescribed medications.  Discharge plan ongoing.       Problem: Infection, Risk of  Goal: Absence of infection  Outcome: Goal Ongoing  Goal: Knowledge of Infection Control Procedures  Outcome: Goal Ongoing     Problem: Falls, High Risk of  Goal: Absence of falls-Adult Patient  Outcome: Goal Ongoing     Problem: Mobility/Activity Intolerance  Goal: Maximize functional ADL's and mobility outcomes  Outcome: Goal Ongoing     Problem: Pain  Goal: Management of pain  Outcome: Goal Ongoing  Goal: Knowledge of pain management  Outcome: Goal Ongoing  Goal: Progress Toward Pain Management Goals  Outcome: Goal Ongoing     Problem: Skin Integrity  Goal: Skin integrity intact  Outcome: Goal Ongoing  Goal: Healing of skin (Pressure Injury)  Outcome: Goal Ongoing     Problem: Anxiety  Goal: Alleviation of anxiety  Outcome: Goal Ongoing     Problem: Respiratory Impairment (Non-Ventilated Patient)  Goal: Effective gas exchange  Outcome: Goal Ongoing  Goal: Effective breathing pattern  Outcome: Goal Ongoing  Goal: Patent airway  Outcome: Goal Ongoing     Problem: Nutrition Deficit  Goal: Adequate nutritional intake  Outcome: Goal Ongoing  Goal: Body weight within specified parameters  Outcome: Goal Ongoing

## 2019-02-13 NOTE — Progress Notes
Received a call from nursing staff regarding patient's Confirmed COVID infection flag.  Flag auto-populated within the chart after an external order result was entered from [facility] for a positive COVID test performed at their facility on 12/09/18.  Flag resolved at this time, as patient met COVID recovered criteria on 02/09/19.

## 2019-02-13 NOTE — Progress Notes
PHYSICAL THERAPY  PROGRESS NOTE        Name: Charles Vaughan        MRN: [id number]          DOB: [birth year]          Age: 63 y.o.  Admission Date: 02/07/2019             LOS: 6 days        Mobility  Patient Turn/Position: Self  Progressive Mobility Level: Stand  Level of Assistance: Assist X1  Assistive Device: Walker  Time Tolerated: 11-30 minutes  Activity Limited By: Dizziness;Shortness of air    Subjective  Significant hospital events: 63 yo M with PMH of HTN and drug use presents to ER with anxiety and abdominal pain. Patient is a poor historian. Reports epigastric abdominal pain for last 3 days. He took Oxycontin today without relief. Per EMS, patient is a known crack cocaine user. Patient admits to last using 2 days ago. Reports difficulty urinating as well as nausea. Denies vomiting, diarrhea, constipation, melena, hematochezia. Patient states headache with blurred vision as well as chest pain and shortness of breath. COVID + 6/13 although pt reports testing positive 4-5 weeks ago as well.   Mental / Cognitive Status: Alert;Cooperative;Follows Commands;Oriented;To Person;To Place;To Time  Pain: Patient complains of pain;Before activity;6/10  Pain Location: Chest;Abdomen  Pain Description: (pressure)  Pain Interventions: Patient agrees to participate in therapy  Comments: 0.5L/min O2 via NC  Ambulation Assist: Independent Mobility in [employer] without Device  Patient Owned Equipment: None  Home Situation: Lives wth Roommate  Type of Home: House  Entry Stairs: No Stairs  In-Home Stairs: No Stairs  Comments: Patient reports history of falls over the last 3 months although increased dizziness/falls over the last week especially. Pt reports he does not use walker. Of note, pt somewhat tangential and poor historian, initially disoriented to place although after further conversation appears more oriented.     Bed Mobility/Transfer  Bed Mobility: Supine to Sit: Standby Assist;Bed Flat;Use of Rail Bed Mobility: Sit to Supine: Standby Assist;Bed Flat  Comments: Pt reports increased dizziness upon sitting; subsides within a few minutes. Pt   Transfer Type: Sit to/from Stand  Transfer: Assistance Level: To/From;Bed;Minimal Assist  Transfer: Assistive Device: Nurse, adult  Transfers: Type Of Assistance: For Safety Considerations;For Balance  End Of Activity Status: In Bed;Nursing Notified;Instructed Patient to Request Assist with Mobility;Instructed Patient to Use Call Light  Comments: Increased dizziness (pt rates 8/10) upon standing. Pt with SOA and shakiness; back to sitting after ~30 seconds. Symptoms resolve back to 6/10 within a few minutes of sitting.    Activity/Exercise  Exercise: Seated;RLE;LLE;Active ROM(ankle pumps; heel raises; marching; LAQs)    Assessment/Progress  Impaired Mobility Due To: Impaired Balance;Decreased Activity Tolerance  Assessment/Progress: Should Improve w/ Continued PT  Comments: Pt complains of dizziness with all position changes. No orthostatics taken this session due to dynamaps unavailable. Pt reports he thinks there might be a possible connection between symptoms and his anxiety.     AM-PAC 6 Clicks Basic Mobility Inpatient  Turning from your back to your side while in a flat bed without using bed rails: A Little  Moving from lying on your back to sitting on the side of a flatbed without using bedrails : A Little  Moving to and from a bed to a chair (including a wheelchair): A Little  Standing up from a chair using your arms (e.g. wheelchair, or bedside chair): A Little  To walk in  hospital room: A Little  Climbing 3-5 steps with a railing: A Little  Raw Score: 18  Standardized (T-scale) Score: 41.05  Basic Mobility CMS 0-100%: 40.47  CMS G Code Modifier for Basic Mobility: CK    Goals  Goal Formulation: With Patient  Time For Goal Achievement: 5 days  Patient Will Go Supine To/From Sit: Independently, Ongoing  Patient Will Transfer Bed/Chair: Independently, Ongoing Patient Will Transfer Sit to Stand: Independently  Patient Will Ambulate: Greater than 200 Feet, w/ Walker, w/ Stand By Assist, Ongoing    Plan  Treatment Interventions: Mobility Training;Balance Activities;Endurance Training  Plan Frequency: 5 Days per Week  PT Plan for Next Visit: formal balance assessment as able, increase stability during ambuation as appropriate(monitor for dizziness, monitor vitals)    PT Discharge Recommendations  Recommendation: Currently patient requires inpatient level of care. However, typical progression for patient condition would anticipate home with assistance at time of discharge.  Patient Currently Requires Physical Assist With: Transfers;Ambulation  Patient Currently Requires Supervision For: Making decisions about safety;Mobility  Patient Currently Requires Equipment: Dan Humphreys with wheels    Therapist: Lauretta Chester, PTA  Date: 02/13/2019

## 2019-02-13 NOTE — Consults
Wound Ostomy Note    NAME:Charles Vaughan                                                                   MRN: [id number]                 DOB:[birth year]          AGE: 63 y.o.  ADMISSION DATE: 02/07/2019             DAYS ADMITTED: LOS: 6 days      Reason for Consult/Visit: pressure injury Stage II or greater    Assessment/Plan:    Principal Problem:    Abdominal pain    42M w/ CAD s/p PCI (05/2018), HTN, HLD, polysubstance abuse (cocaine, etoh), presented to ED w/ 3d of epigastric pain, subjective fevers, dyspnea, poor PO intake, w/ weakness/ dizziness/ falls, in setting of recent COVID-19 pna (4-5wks ago; +SARS-CoV2 on 06/13 but pt asymptomatic. No indication for isolation). On arrival, noted to have hyponatremia (Na 121) w/ AG, respiratory alkalosis, and UDS positive for cocaine. Pt admitted for further mgmt.     [REMOVED] Pressure Injury 02/07/19 0544 Yes Coccyx Stage 2 (Removed)   02/07/19 0544    Pressure Injury Present On Inpatient Admission: Y   Pressure Injury Orientation:    Wound Location: Coccyx   Pressure Injury Stages: Stage 2   If this pressure injury is suspected to be device related, please select the device::    Removed 02/13/19 1500   Wound Image  02/09/2019 10:44 AM   Agree With My Assessment? Yes 02/10/2019  4:45 AM   Wound Dressing Status Intact 02/12/2019  8:30 PM   Wound Dressing and / or Treatment [employer]icaid Barrier Cream 02/12/2019  8:30 PM   Wound Drainage Amount None 02/12/2019  8:30 PM   Wound Base Assessment Dry;Intact;Pink 02/12/2019  8:30 PM   Surrounding Skin Assessment Dry;Intact 02/12/2019  8:30 PM     Wound Team was re-consulted re: coccyx     Nursing is documenting a coccyx stage 2 pressure injury. Upon assessment no pressure injury noted to coccyx; skin over this area is intact, pink and blanchable. Linear breaks in skin along mid gluteal cleft are indicative of moisture related skin damage. Patient is continent of bowel and bladder. RECOMMEND: Apply barrier cream BID and PRN soiling from stool or urine.      Wound Team will sign off. Thank you.     Sanda Klein, RN, BSN, [facility]  Wound/Ostomy Team   Pager: [phone]  After Hours Wound/OstomyTeam Pager: [phone]

## 2019-02-14 IMAGING — CT Head^_WITHOUT_CONTRAST (Adult)
1 series · 16 of 30 positions shown, 20 images · non-contrast
Comparison: none

[Series 3: brain w/o 4.8 brain · axial · non-contrast · 0.47mm/px · z∈[+149,+308]mm · 16 of 33 slices shown, 20 images]
[im 2/33  brain]
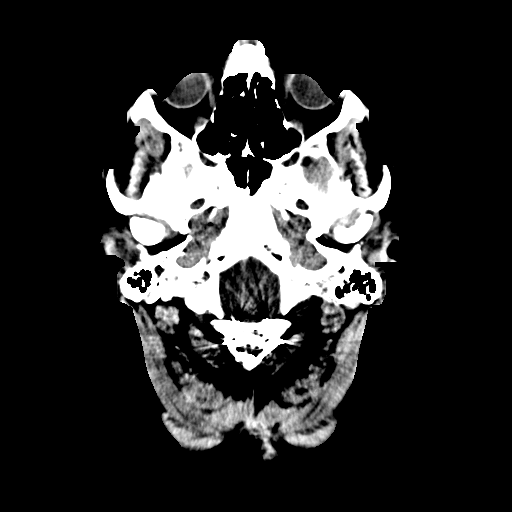
[im 2/33  bone]
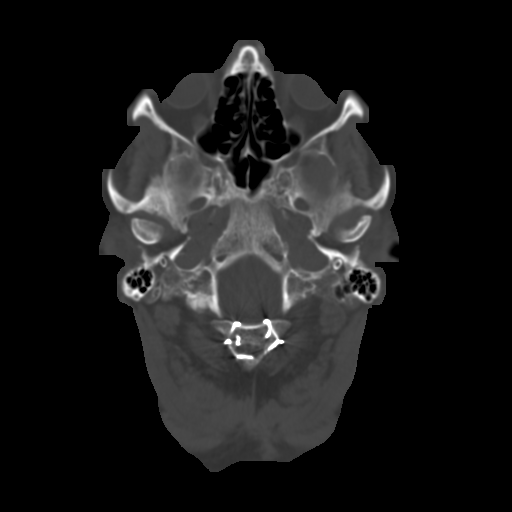
[im 4/33  brain]
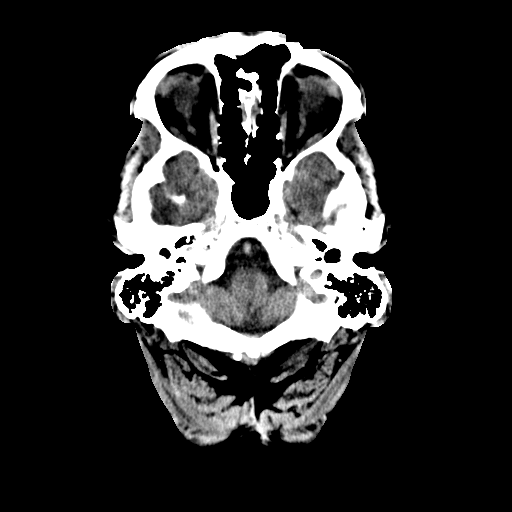
[im 6/33  brain]
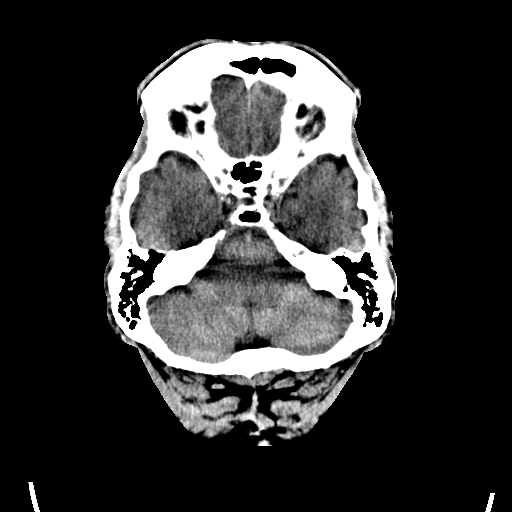
[im 8/33  brain]
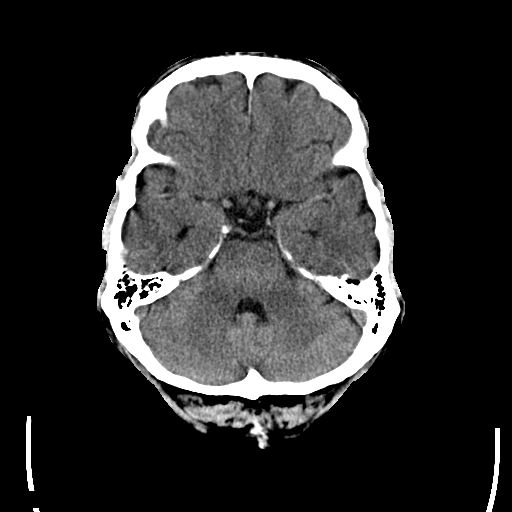
[im 9/33  brain]
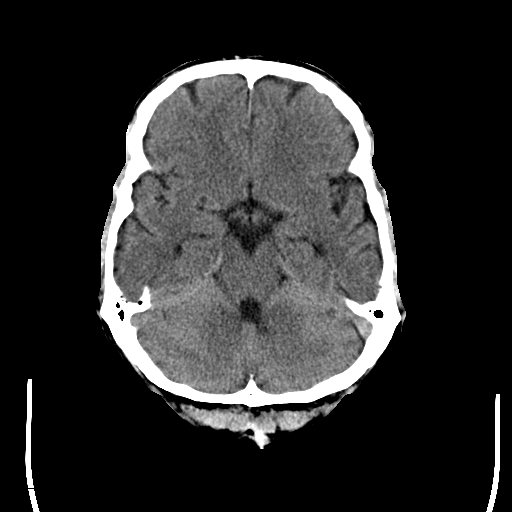
[im 9/33  bone]
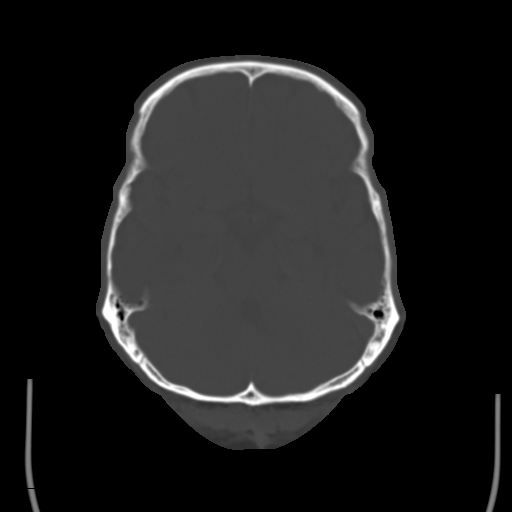
[im 12/33  brain]
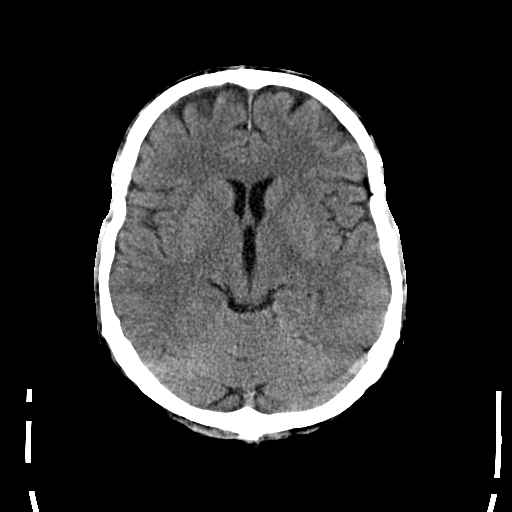
[im 14/33  brain]
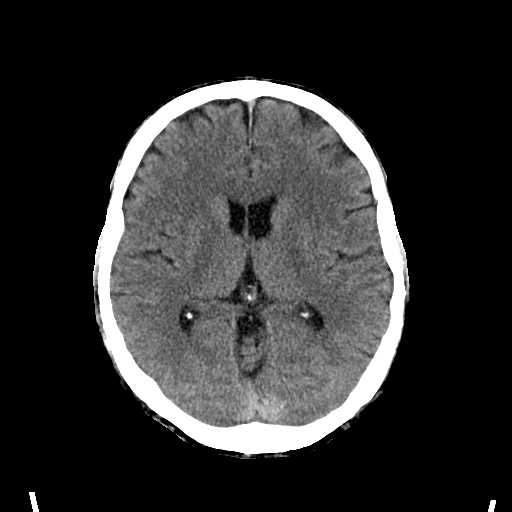
[im 16/33  brain]
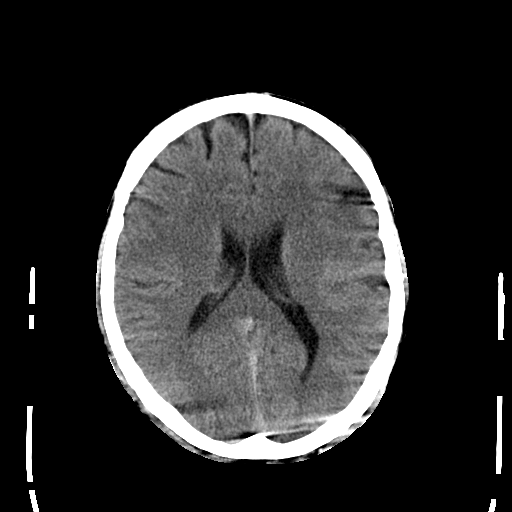
[im 17/33  brain]
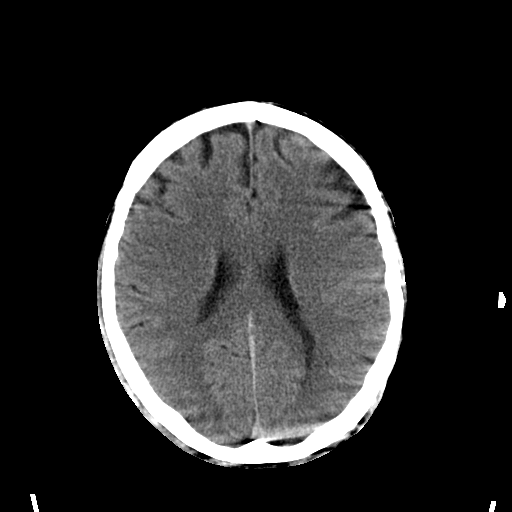
[im 17/33  bone]
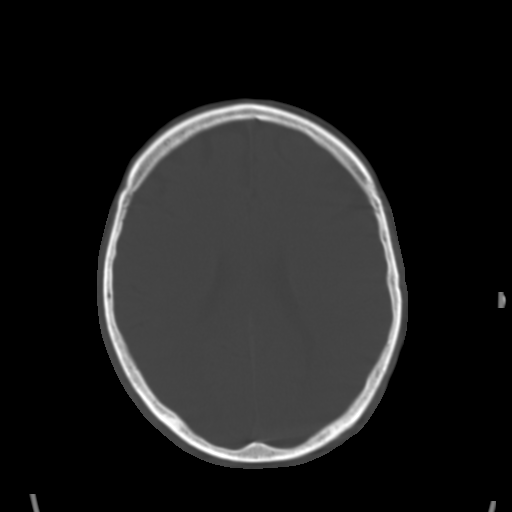
[im 19/33  brain]
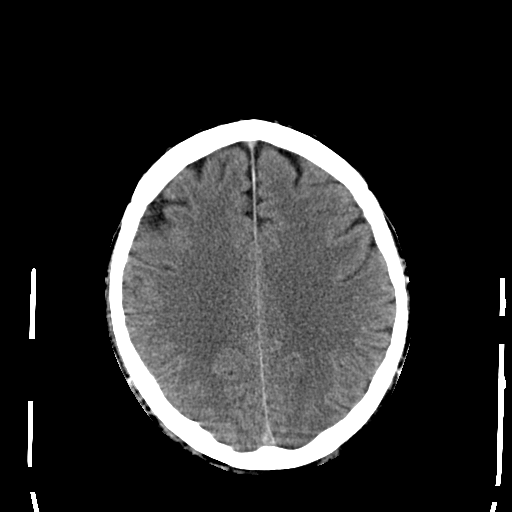
[im 21/33  brain]
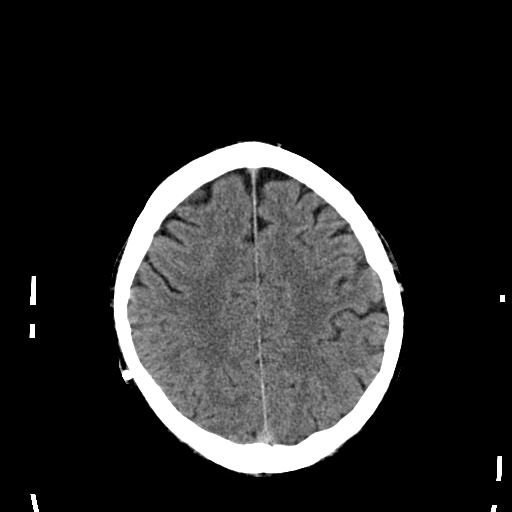
[im 24/33  brain]
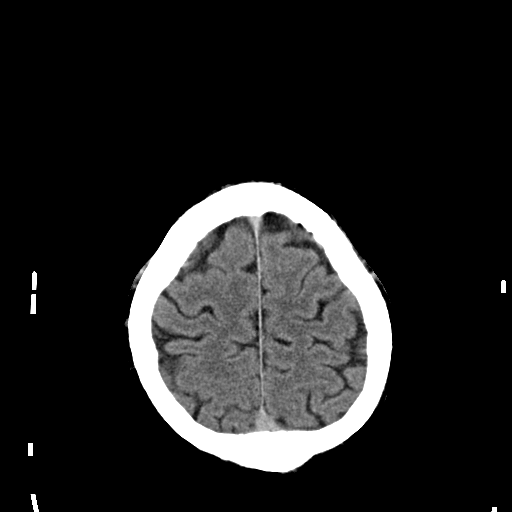
[im 25/33  brain]
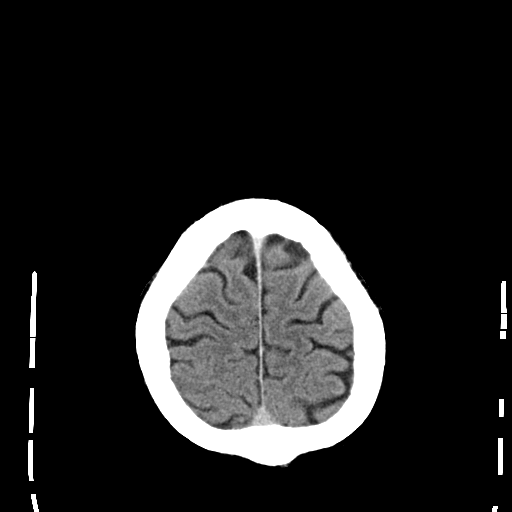
[im 25/33  bone]
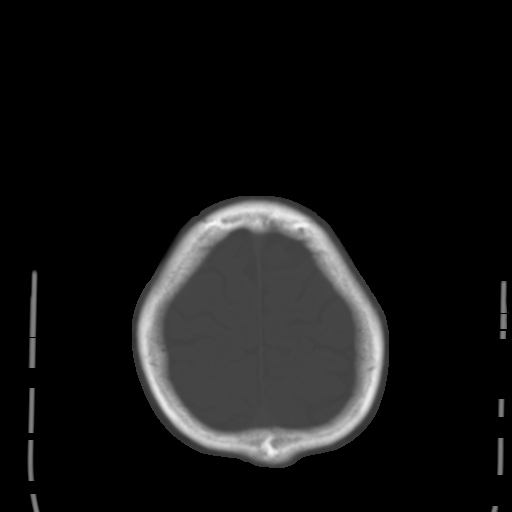
[im 27/33  brain]
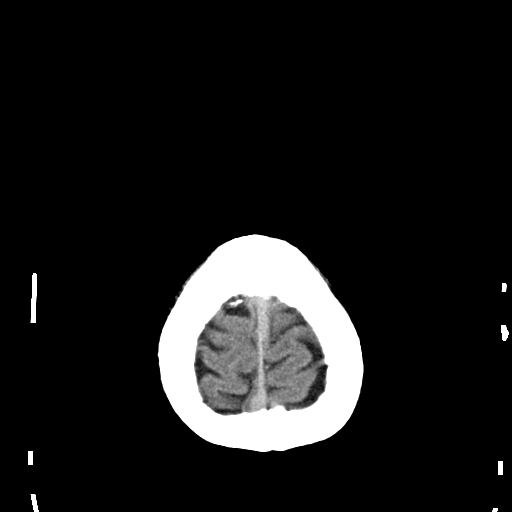
[im 29/33  brain]
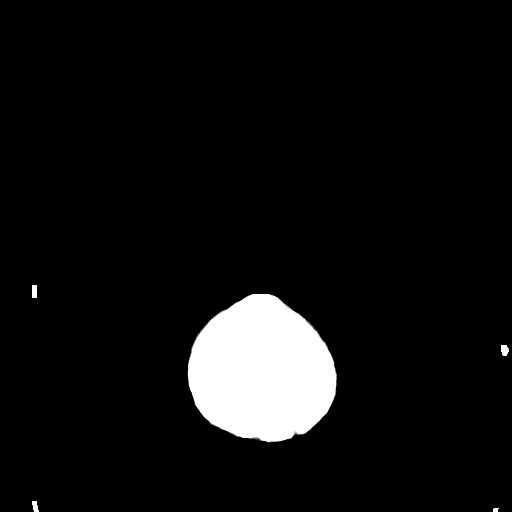
[im 31/33  brain]
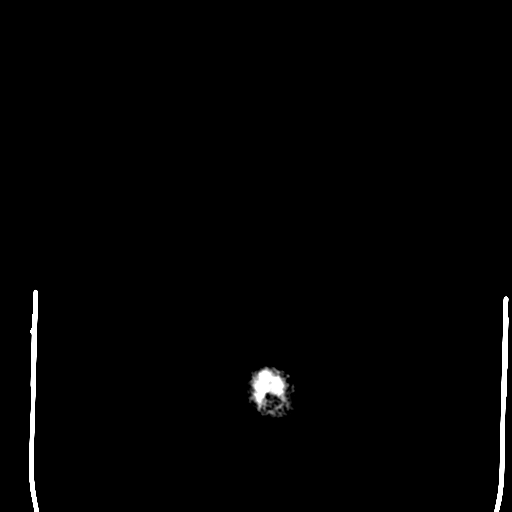

[16 of 30 positions shown; findings below may reference images not displayed]

DIAGNOSTIC STUDIES
All CT scans at this facility use dose modulation, iterative reconstruction, and/or weight based
dosing when appropriate to reduce radiation dose to as low as reasonably achievable.

EXAM

INDICATION

Confusion, disorientation

TECHNIQUE

Unenhanced CT brain

COMPARISONS

None available

FINDINGS

Bone windows demonstrate no depressed skull fracture. Mild atrophy is present. Mild decreased white
matter attenuation is most consistent with mild small vessel disease. No hemorrhage or acute
cortical infarction. Temporal lobes and posterior fossa intact.

IMPRESSION
Mild atrophy, small vessel disease. No hemorrhage or acute cortical infarction. MRI can further
evaluate if indicated

Tech Notes:

Pt confusion/disorientation. AW

## 2019-02-14 NOTE — Progress Notes
RT Adult Assessment Note    NAME:Charles Vaughan             MRN: [id number]             DOB:[birth year]          AGE: 63 y.o.  ADMISSION DATE: 02/07/2019             DAYS ADMITTED: LOS: 7 days    RT Treatment Plan:  Protocol Plan: Medications  Albuterol: Discontinued  Ipratropium: Discontinued  Combivent Respimat: (combivent PRN)    Protocol Plan: Procedures  Comment: Combivent PRN    Additional Comments:  Impressions of the patient: Pt believes SOA on exertion it to be anxiety-related. Clear breath sounds and no apparent distress at this time.   Intervention(s)/outcome(s):  Pt states he needs his A/A inhaler. Given with no change in status.    Vital Signs:  Pulse: 94  RR: 18 PER MINUTE  SpO2: 98 %  O2 Device:    Liter Flow:    O2%: 21 %  Breath Sounds: Clear (Implies normal);Decreased  Respiratory Effort: SOA (Short of Air) on Exertion;Non-Labored

## 2019-02-14 NOTE — Care Plan
Problem: Infection, Risk of  Goal: Absence of infection  Outcome: Goal Ongoing  Goal: Knowledge of Infection Control Procedures  Outcome: Goal Ongoing     Problem: Falls, High Risk of  Goal: Absence of falls-Adult Patient  Outcome: Goal Ongoing  Note: High fall risk bundle implemented.       Problem: Mobility/Activity Intolerance  Goal: Maximize functional ADL's and mobility outcomes  Outcome: Goal Ongoing     Problem: Pain  Goal: Management of pain  Outcome: Goal Ongoing  Note: Pt's pain level is routinely being assessed.    Goal: Knowledge of pain management  Outcome: Goal Ongoing  Goal: Progress Toward Pain Management Goals  Outcome: Goal Ongoing     Problem: Skin Integrity  Goal: Skin integrity intact  Outcome: Goal Ongoing  Goal: Healing of skin (Pressure Injury)  Outcome: Goal Ongoing     Problem: Anxiety  Goal: Alleviation of anxiety  Outcome: Goal Ongoing     Problem: Respiratory Impairment (Non-Ventilated Patient)  Goal: Effective gas exchange  Outcome: Goal Ongoing  Goal: Effective breathing pattern  Outcome: Goal Ongoing  Goal: Patent airway  Outcome: Goal Ongoing     Problem: Nutrition Deficit  Goal: Adequate nutritional intake  Outcome: Goal Ongoing  Goal: Body weight within specified parameters  Outcome: Goal Ongoing     Problem: Discharge Planning  Goal: Participation in plan of care  Outcome: Goal Ongoing  Note: Pt is actively participating in planning of care.    Goal: Knowledge regarding plan of care  Outcome: Goal Ongoing  Goal: Prepared for discharge  Outcome: Goal Ongoing  Note: Pt will discharge when medically appropriate.

## 2019-02-14 NOTE — Care Plan
Pt. Is actively involved in plan of care.  Pt. Pain has been managed by medications or other therapeutic means.   Pt. Understands the different pain management routes available.   Fall bundle in place; Bed alarm on; Education provided  Pt. Has eaten well today.   Pt. Anxiety has been managed by prescribed medications.  Discharge plan ongoing.       Problem: Infection, Risk of  Goal: Absence of infection  Outcome: Goal Ongoing  Goal: Knowledge of Infection Control Procedures  Outcome: Goal Ongoing     Problem: Falls, High Risk of  Goal: Absence of falls-Adult Patient  Outcome: Goal Ongoing     Problem: Mobility/Activity Intolerance  Goal: Maximize functional ADL's and mobility outcomes  Outcome: Goal Ongoing     Problem: Pain  Goal: Management of pain  Outcome: Goal Ongoing  Goal: Knowledge of pain management  Outcome: Goal Ongoing  Goal: Progress Toward Pain Management Goals  Outcome: Goal Ongoing     Problem: Skin Integrity  Goal: Skin integrity intact  Outcome: Goal Ongoing  Goal: Healing of skin (Pressure Injury)  Outcome: Goal Ongoing     Problem: Anxiety  Goal: Alleviation of anxiety  Outcome: Goal Ongoing     Problem: Respiratory Impairment (Non-Ventilated Patient)  Goal: Effective gas exchange  Outcome: Goal Ongoing  Goal: Effective breathing pattern  Outcome: Goal Ongoing  Goal: Patent airway  Outcome: Goal Ongoing     Problem: Nutrition Deficit  Goal: Adequate nutritional intake  Outcome: Goal Ongoing  Goal: Body weight within specified parameters  Outcome: Goal Ongoing     Problem: Discharge Planning  Goal: Participation in plan of care  Outcome: Goal Ongoing  Goal: Knowledge regarding plan of care  Outcome: Goal Ongoing  Goal: Prepared for discharge  Outcome: Goal Ongoing

## 2019-02-14 NOTE — Progress Notes
General Progress Note    Name:  Charles Vaughan   Today's Date:  02/14/2019  Admission Date: 02/07/2019  LOS: 7 days                     Assessment/Plan:      75M w/ CAD s/p PCI (05/2018), HTN, HLD, polysubstance abuse (cocaine, etoh), presented to ED w/ 3d of epigastric pain, subjective fevers, dyspnea, poor PO intake, w/ weakness/ dizziness/ falls, in setting of recent COVID-19 pna (4-5wks ago; +SARS-CoV2 on 06/13 but pt asymptomatic. No indication for isolation). On arrival, noted to have hyponatremia (Na 121) w/ AG, respiratory alkalosis, and UDS positive for cocaine. Pt admitted for further mgmt.     Dizziness, improving  Falls  - Orthostatics negative initially, but repeated 06/16 due to worsening/persistent sx  - Slowed/abnormal finger to nose in b/l UES, no nystagmus on exam, normal heel to shin, no focal deficits  - PT also evaluated at bedside  - Patient notes multiple falls at home and does not feels safe going home at this time  - He does note history of spinal surgery when he was a teenager  - 06/15 MRI head/neck: MRI brain unremarkable. 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 There is also foraminal stenosis which is limited in estimation of severity due to artifact  - Echo unremarkable  Plan:  - orhtostatic hypotension mgmt below  - PT/OT to follow and will mobilize and reassess this weekend  ???  Abdominal pain- resolved  -Onset 3 days ago  -Lower abdomen radiating upwards  -7 out of 10 pain, constant  -Some improvement with morphine  -No change with food  -Lactic acid 1.7  -CT abdomen pelvis with contrast  ???????????????????????????????????????-No radiopaque gallstones, no bile ductal dilatation, unremarkable kidney and ureters, unremarkable pancreas  ???????????????????????????????????????-Reactive periportal lymph nodes  ???????????????????????????????????????-Abdominal aorta and iliac arteries normal with moderate plaque ???????????????????????????????????????-No ascites/free air, no large or small bowel loop abnormalities  ???????????????????????????????????????-Bladder is partially distended, prostate unremarkable  -CTA chest  ???????????????????????????????????????-No evidence of acute segmental pulmonary embolism  ???????????????????????????????????????-Peripheral pulmonary arteries limited by motion  ???????????????????????????????????????-Mild bronchial wall thickening, no pneumonia  -Has no obvious reason for peritonitis, lactic acid 1.7 is against ischemia  -CT abdomen pelvis done with contrast which is not ideal to evaluate abdominal aortic dissection.??????While patient is at high risk due to cocaine use it is less likely that has been ongoing for 3 days and BP has been stable throughout his ED visit  -Could be related to bladder spasms and urinary retention  Plan:  - Continue to monitor  - Foley removed and no evidence of retention thus far. Had been started on flomax for retention, however, stopped on 06/16 due to symptomatic orthostatic hypotension  - zofran, maalox for abd discomfort  ???  Anion gap metabolic acidosis, resolved  Respiratory alkalosis, resolved  -Serum Osm???250,???suggesting that there are no other alcohols causing Gap  -Poor p.o. intake over the last few weeks worse over the last few days  -Suspect that anion gap is result of starvation ketosis  -Suspect that respiratory alkalosis is secondary to hyperventilation from abdominal pain???vs anxiety  -Trop neg x3  -EKG sinus tach, HR 114, twi V2, no ST changes, significant artifact  Plan:  - Monitor BMP  ???  Hyponatremia, resolved  - Urine Osm 64, urine sodium less than 10  - Serum Osm 250,???specific gravity 1.002  - Consistent with???ADH  independent cause  - Less likely psychogenic polydipsia or renal failure  - More likely a result of poor solute intake  - Could also be related to urinary retention  PLAN:  - Monitor BMP  ???  Urinary retention  - Foley placed in ED, 700 cc out  - Foley removed 6/15 with no evidence of retention  - Continue Tamsulosin  ???  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   -Tamsulosin discontinued as well    Alcohol use disorder   Cocaine use  - UDS positive for cocaine  - Denies any recent alcohol use  ???  COVID positive  - Reportedly tested positive for COVID-19???4 to 5 weeks ago then negative on repeat.  - Repeat testing 6/13 positive. Discussed with IPAC who notes okay to remove patient from isolation.  ???    DVT ppx: lovenox    Fluids: n/a  Electrolytes: monitor daily  Nutrition: cardiac  PT/OT: following      Dispo: Discussed with social work and apparently he does not have any more skilled days remaining.  He is not at the point where he can discharge home without inpatient level of assistance.however after a short time working with PT in house he will most likely be able to discharge home without need for inpatient assistance.      Staff name:  Ory Elting, DO Date:  02/14/2019   Med-private Team E  Team Pager [phone]  ________________________________________________________________________    Subjective    No acute events overnight.  He endorses some dizziness and galt instability.  Denies chest pain, shortness of air, nausea or emesis.    Medications  Scheduled Meds:ascorbic acid (VITAMIN C) tablet 250 mg, 250 mg, Oral, QDAY  aspirin chewable tablet 81 mg, 81 mg, Oral, QDAY  atorvastatin (LIPITOR) tablet 40 mg, 40 mg, Oral, QDAY  clopiDOGrel (PLAVIX) tablet 75 mg, 75 mg, Oral, QDAY  enoxaparin (LOVENOX) syringe 30 mg, 30 mg, Subcutaneous, BID  pantoprazole DR (PROTONIX) tablet 40 mg, 40 mg, Oral, QDAY(21)  QUEtiapine (SEROquel) tablet 200 mg, 200 mg, Oral, QHS vitamins, multi w/minerals tablet 1 tablet, 1 tablet, Oral, QDAY    Continuous Infusions:  PRN and Respiratory Meds:acetaminophen Q4H PRN, aluminum/magnesium hydroxide TID w/ meals PRN, benzonatate TID PRN, clonazePAM BID PRN, clonazePAM QHS PRN, ipratropium/albuterol PRN, ondansetron (ZOFRAN) IV Q6H PRN        Objective:                          Vital Signs: Last Filed                 Vital Signs: 24 Hour Range   BP: 111/69 (06/20 1359)  Temp: 36.4 ???C (97.6 ???F) (06/20 1359)  Pulse: 90 (06/20 1359)  Respirations: 16 PER MINUTE (06/20 1359)  SpO2: 94 % (06/20 1359) BP: (105-131)/(58-79)   Temp:  [36.2 ???C (97.1 ???F)-36.6 ???C (97.8 ???F)]   Pulse:  [53-95]   Respirations:  [16 PER MINUTE-18 PER MINUTE]   SpO2:  [94 %-98 %]    Intensity Pain Scale (Self Report): 4 (02/14/19 1036) Vitals:  02/07/19 0524 02/09/19 1400 02/13/19 1019   Weight: 75 kg (165 lb 4.8 oz) 76.4 kg (168 lb 6.4 oz) 76.2 kg (168 lb)       Intake/Output Summary:  (Last 24 hours)    Intake/Output Summary (Last 24 hours) at 02/14/2019 1603  Last data filed at 02/14/2019 1400  Gross per 24 hour   Intake 902 ml   Output 1850 ml   Net -948 ml      Stool Occurrence: 0      Physical exam:  General: NAD  HEENT: Normocephalic and atraumatic.  Moist Mucous membranes.  Pupils are equal, round and reactive to light. No discharge. No scleral icterus. Missing several teeth  Cardiovascular: bradycardic, regular rhythm, no murmurs.   Pulmonary/Chest: Effort normal and breath sounds normal. No respiratory distress. No wheezes or crackles  Abdominal: Soft, nondistended, nontender, and no mass. Bowel sounds are normal.  Ext:  No pitting edema in extremities. Nml distal pulses  Neurological: A&Ox3, no focal findings  Skin: Warm and dry. No rash noted. No erythema. No pallor.    Lab/Imaging Reviewed  24-hour labs:    Results for orders placed or performed during the hospital encounter of 02/07/19 (from the past 24 hour(s))   CBC AND DIFF    Collection Time: 02/14/19  6:16 AM Result Value Ref Range    White Blood Cells 4.6 4.5 - 11.0 K/UL    RBC 4.76 4.4 - 5.5 M/UL    Hemoglobin 13.5 13.5 - 16.5 GM/DL    Hematocrit 09.8 (L) 40 - 50 %    MCV 82.6 80 - 100 FL    MCH 28.3 26 - 34 PG    MCHC 34.3 32.0 - 36.0 G/DL    RDW 11.9 (H) 11 - 15 %    Platelet Count 308 150 - 400 K/UL    MPV 8.1 7 - 11 FL    Neutrophils 51 41 - 77 %    Lymphocytes 32 24 - 44 %    Monocytes 11 4 - 12 %    Eosinophils 5 0 - 5 %    Basophils 1 0 - 2 %    Absolute Neutrophil Count 2.35 1.8 - 7.0 K/UL    Absolute Lymph Count 1.44 1.0 - 4.8 K/UL    Absolute Monocyte Count 0.51 0 - 0.80 K/UL    Absolute Eosinophil Count 0.24 0 - 0.45 K/UL    Absolute Basophil Count 0.03 0 - 0.20 K/UL   COMPREHENSIVE METABOLIC PANEL    Collection Time: 02/14/19  6:16 AM   Result Value Ref Range    Sodium 140 137 - 147 MMOL/L    Potassium 4.2 3.5 - 5.1 MMOL/L    Chloride 104 98 - 110 MMOL/L    Glucose 95 70 - 100 MG/DL    Blood Urea Nitrogen 16 7 - 25 MG/DL    Creatinine 1.47 0.4 - 1.24 MG/DL    Calcium 9.4 8.5 - 82.9 MG/DL    Total Protein 6.8 6.0 - 8.0 G/DL    Total Bilirubin 0.2 (L) 0.3 - 1.2 MG/DL    Albumin 4.0 3.5 - 5.0 G/DL    Alk Phosphatase 86 25 - 110 U/L    AST (SGOT) 37 7 - 40 U/L    CO2 27 21 - 30 MMOL/L    ALT (SGPT) 46 7 - 56 U/L    Anion Gap 9 3 - 12    eGFR Non African American >60 >60 mL/min  eGFR African American >60 >60 mL/min

## 2019-02-15 MED ORDER — MELATONIN 3 MG PO TAB
3 mg | Freq: Every evening | ORAL | 0 refills | Status: DC | PRN
Start: 2019-02-15 — End: 2019-02-19
  Administered 2019-02-15 – 2019-02-19 (×5): 3 mg via ORAL

## 2019-02-15 NOTE — Care Plan
Problem: Infection, Risk of  Goal: Absence of infection  Outcome: Goal Ongoing  Goal: Knowledge of Infection Control Procedures  Outcome: Goal Ongoing     Problem: Falls, High Risk of  Goal: Absence of falls-Adult Patient  Outcome: Goal Ongoing     Problem: Mobility/Activity Intolerance  Goal: Maximize functional ADL's and mobility outcomes  Outcome: Goal Ongoing     Problem: Pain  Goal: Management of pain  Outcome: Goal Ongoing  Goal: Knowledge of pain management  Outcome: Goal Ongoing  Goal: Progress Toward Pain Management Goals  Outcome: Goal Ongoing     Problem: Skin Integrity  Goal: Skin integrity intact  Outcome: Goal Ongoing  Goal: Healing of skin (Pressure Injury)  Outcome: Goal Ongoing     Problem: Anxiety  Goal: Alleviation of anxiety  Outcome: Goal Ongoing     Problem: Respiratory Impairment (Non-Ventilated Patient)  Goal: Effective gas exchange  Outcome: Goal Ongoing  Goal: Effective breathing pattern  Outcome: Goal Ongoing  Goal: Patent airway  Outcome: Goal Ongoing     Problem: Nutrition Deficit  Goal: Adequate nutritional intake  Outcome: Goal Ongoing  Goal: Body weight within specified parameters  Outcome: Goal Ongoing     Problem: Discharge Planning  Goal: Participation in plan of care  Outcome: Goal Ongoing  Goal: Knowledge regarding plan of care  Outcome: Goal Ongoing  Goal: Prepared for discharge  Outcome: Goal Ongoing

## 2019-02-15 NOTE — Progress Notes
General Progress Note    Name:  Charles Vaughan   Today's Date:  02/15/2019  Admission Date: 02/07/2019  LOS: 8 days                     Assessment/Plan:      58M w/ CAD s/p PCI (05/2018), HTN, HLD, polysubstance abuse (cocaine, etoh), presented to ED w/ 3d of epigastric pain, subjective fevers, dyspnea, poor PO intake, w/ weakness/ dizziness/ falls, in setting of recent COVID-19 pna (4-5wks ago; +SARS-CoV2 on 06/13 but pt asymptomatic. No indication for isolation). On arrival, noted to have hyponatremia (Na 121) w/ AG, respiratory alkalosis, and UDS positive for cocaine. Pt admitted for further mgmt.     Dizziness, improving  Falls  - Orthostatics negative initially, but repeated 06/16 due to worsening/persistent sx  - Slowed/abnormal finger to nose in b/l UES, no nystagmus on exam, normal heel to shin, no focal deficits  - PT also evaluated at bedside  - Patient notes multiple falls at home and does not feels safe going home at this time  - He does note history of spinal surgery when he was a teenager  - 06/15 MRI head/neck: MRI brain unremarkable. 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 There is also foraminal stenosis which is limited in estimation of severity due to artifact  - Echo unremarkable  Plan:  - orhtostatic hypotension mgmt below  - PT/OT to follow and will mobilize and reassess this weekend  ???  Abdominal pain- resolved  -Onset 3 days ago  -Lower abdomen radiating upwards  -7 out of 10 pain, constant  -Some improvement with morphine  -No change with food  -Lactic acid 1.7  -CT abdomen pelvis with contrast  ???????????????????????????????????????-No radiopaque gallstones, no bile ductal dilatation, unremarkable kidney and ureters, unremarkable pancreas  ???????????????????????????????????????-Reactive periportal lymph nodes  ???????????????????????????????????????-Abdominal aorta and iliac arteries normal with moderate plaque ???????????????????????????????????????-No ascites/free air, no large or small bowel loop abnormalities  ???????????????????????????????????????-Bladder is partially distended, prostate unremarkable  -CTA chest  ???????????????????????????????????????-No evidence of acute segmental pulmonary embolism  ???????????????????????????????????????-Peripheral pulmonary arteries limited by motion  ???????????????????????????????????????-Mild bronchial wall thickening, no pneumonia  -Has no obvious reason for peritonitis, lactic acid 1.7 is against ischemia  -CT abdomen pelvis done with contrast which is not ideal to evaluate abdominal aortic dissection.??????While patient is at high risk due to cocaine use it is less likely that has been ongoing for 3 days and BP has been stable throughout his ED visit  -Could be related to bladder spasms and urinary retention  Plan:  - Continue to monitor  - Foley removed and no evidence of retention thus far. Had been started on flomax for retention, however, stopped on 06/16 due to symptomatic orthostatic hypotension  - zofran, maalox for abd discomfort  ???  Anion gap metabolic acidosis, resolved  Respiratory alkalosis, resolved  -Serum Osm???250,???suggesting that there are no other alcohols causing Gap  -Poor p.o. intake over the last few weeks worse over the last few days  -Suspect that anion gap is result of starvation ketosis  -Suspect that respiratory alkalosis is secondary to hyperventilation from abdominal pain???vs anxiety  -Trop neg x3  -EKG sinus tach, HR 114, twi V2, no ST changes, significant artifact  Plan:  - Monitor BMP  ???  Hyponatremia, resolved  - Urine Osm 64, urine sodium less than 10  - Serum Osm 250,???specific gravity 1.002  - Consistent with???ADH  independent cause  - Less likely psychogenic polydipsia or renal failure  - More likely a result of poor solute intake  - Could also be related to urinary retention  PLAN:  - Monitor BMP  ???  Urinary retention  - Foley placed in ED, 700 cc out  - Foley removed 6/15 with no evidence of retention  - Continue Tamsulosin  ???  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   -Tamsulosin discontinued as well    Alcohol use disorder   Cocaine use  - UDS positive for cocaine  - Denies any recent alcohol use  ???  COVID positive  - Reportedly tested positive for COVID-19???4 to 5 weeks ago then negative on repeat.  - Repeat testing 6/13 positive. Discussed with IPAC who notes okay to remove patient from isolation.  ???    DVT ppx: lovenox    Fluids: n/a  Electrolytes: monitor daily  Nutrition: cardiac  PT/OT: following      Dispo: Discussed with social work and apparently he does not have any more skilled days remaining.  He is not at the point where he can discharge home without inpatient level of assistance.however after a short time working with PT in house he will most likely be able to discharge home without need for inpatient assistance.  -Continue inpatient physical therapy this weekend.  Plan to reassess tomorrow and possible discharge.    Staff name:  Tammera Engert, DO Date:  02/15/2019   Med-private Team E  Team Pager [phone]  ________________________________________________________________________    Subjective    No acute events overnight.  He endorses some dizziness and galt instability.  Denies chest pain, shortness of air, nausea or emesis.  Feels he has a little bit more energy than yesterday.    Medications  Scheduled Meds:ascorbic acid (VITAMIN C) tablet 250 mg, 250 mg, Oral, QDAY  aspirin chewable tablet 81 mg, 81 mg, Oral, QDAY  atorvastatin (LIPITOR) tablet 40 mg, 40 mg, Oral, QDAY  clopiDOGrel (PLAVIX) tablet 75 mg, 75 mg, Oral, QDAY enoxaparin (LOVENOX) syringe 30 mg, 30 mg, Subcutaneous, BID  pantoprazole DR (PROTONIX) tablet 40 mg, 40 mg, Oral, QDAY(21)  QUEtiapine (SEROquel) tablet 200 mg, 200 mg, Oral, QHS  vitamins, multi w/minerals tablet 1 tablet, 1 tablet, Oral, QDAY    Continuous Infusions:  PRN and Respiratory Meds:acetaminophen Q4H PRN, aluminum/magnesium hydroxide TID w/ meals PRN, benzonatate TID PRN, clonazePAM BID PRN, clonazePAM QHS PRN, ipratropium/albuterol PRN, melatonin QHS PRN, ondansetron (ZOFRAN) IV Q6H PRN        Objective:                          Vital Signs: Last Filed                 Vital Signs: 24 Hour Range   BP: 119/82 (06/21 1001)  Temp: 36.4 ???C (97.6 ???F) (06/21 1001)  Pulse: 90 (06/21 1240)  Respirations: 16 PER MINUTE (06/21 1240)  SpO2: 95 % (06/21 1240) BP: (111-135)/(69-82)   Temp:  [36.3 ???C (97.3 ???F)-36.7 ???C (98.1 ???F)]   Pulse:  [86-93]  Respirations:  [16 PER MINUTE]   SpO2:  [94 %-98 %]    Intensity Pain Scale (Self Report): 5 (02/15/19 0900) Vitals:    02/07/19 0524 02/09/19 1400 02/13/19 1019   Weight: 75 kg (165 lb 4.8 oz) 76.4 kg (168 lb 6.4 oz) 76.2 kg (168 lb)       Intake/Output Summary:  (Last 24 hours)    Intake/Output Summary (Last 24 hours) at 02/15/2019 1313  Last data filed at 02/15/2019 1051  Gross per 24 hour   Intake 649 ml   Output 3225 ml   Net -2576 ml      Stool Occurrence: 1      Physical exam:  General: NAD  HEENT: Normocephalic and atraumatic.  Moist Mucous membranes.  Pupils are equal, round and reactive to light. No discharge. No scleral icterus. Missing several teeth  Cardiovascular: bradycardic, regular rhythm, no murmurs.   Pulmonary/Chest: Effort normal and breath sounds normal. No respiratory distress. No wheezes or crackles  Abdominal: Soft, nondistended, nontender, and no mass. Bowel sounds are normal.  Ext:  No pitting edema in extremities. Nml distal pulses  Neurological: A&Ox3, no focal findings  Skin: Warm and dry. No rash noted. No erythema. No pallor. Lab/Imaging Reviewed  24-hour labs:    Results for orders placed or performed during the hospital encounter of 02/07/19 (from the past 24 hour(s))   CBC AND DIFF    Collection Time: 02/15/19  4:59 AM   Result Value Ref Range    White Blood Cells 5.9 4.5 - 11.0 K/UL    RBC 4.74 4.4 - 5.5 M/UL    Hemoglobin 13.3 (L) 13.5 - 16.5 GM/DL    Hematocrit 16.1 (L) 40 - 50 %    MCV 81.3 80 - 100 FL    MCH 27.9 26 - 34 PG    MCHC 34.4 32.0 - 36.0 G/DL    RDW 09.6 (H) 11 - 15 %    Platelet Count 314 150 - 400 K/UL    MPV 8.2 7 - 11 FL    Neutrophils 51 41 - 77 %    Lymphocytes 27 24 - 44 %    Monocytes 16 (H) 4 - 12 %    Eosinophils 5 0 - 5 %    Basophils 1 0 - 2 %    Absolute Neutrophil Count 3.04 1.8 - 7.0 K/UL    Absolute Lymph Count 1.59 1.0 - 4.8 K/UL    Absolute Monocyte Count 0.94 (H) 0 - 0.80 K/UL    Absolute Eosinophil Count 0.29 0 - 0.45 K/UL    Absolute Basophil Count 0.06 0 - 0.20 K/UL   COMPREHENSIVE METABOLIC PANEL    Collection Time: 02/15/19  4:59 AM   Result Value Ref Range    Sodium 139 137 - 147 MMOL/L    Potassium 4.3 3.5 - 5.1 MMOL/L    Chloride 105 98 - 110 MMOL/L    Glucose 77 70 - 100 MG/DL    Blood Urea Nitrogen 16 7 - 25 MG/DL    Creatinine 0.45 0.4 - 1.24 MG/DL    Calcium 9.3 8.5 - 40.9 MG/DL    Total Protein 6.8 6.0 - 8.0 G/DL    Total Bilirubin 0.2 (L) 0.3 - 1.2 MG/DL    Albumin 3.9 3.5 - 5.0 G/DL    Alk Phosphatase 88 25 - 110 U/L    AST (SGOT) 31 7 - 40 U/L    CO2 27 21 - 30 MMOL/L  ALT (SGPT) 48 7 - 56 U/L    Anion Gap 7 3 - 12    eGFR Non African American >60 >60 mL/min    eGFR African American >60 >60 mL/min

## 2019-02-15 NOTE — Progress Notes
PHYSICAL THERAPY  PROGRESS NOTE        Name: Charles Vaughan        MRN: [id number]          DOB: 1956-07-26          Age: 63 y.o.  Admission Date: 02/07/2019             LOS: 8 days        Mobility  Patient Turn/Position: Self  Progressive Mobility Level: Active transfer to chair  Distance Walked (feet): 1 ft  Level of Assistance: Assist X1  Assistive Device: Walker  Time Tolerated: 11-30 minutes  Activity Limited By: Shortness of air;Dizziness;Fatigue;Nausea    Subjective  Significant hospital events: 63 yo M with PMH of HTN and drug use presents to ER with anxiety and abdominal pain. Patient is a poor historian. Reports epigastric abdominal pain for last 3 days. He took Oxycontin today without relief. Per EMS, patient is a known crack cocaine user. Patient admits to last using 2 days ago. Reports difficulty urinating as well as nausea. Denies vomiting, diarrhea, constipation, melena, hematochezia. Patient states headache with blurred vision as well as chest pain and shortness of breath. COVID + 6/13 although pt reports testing positive 4-5 weeks ago as well.   Mental / Cognitive Status: Alert;Oriented;Cooperative;Follows Commands  Pain: Patient has no complaint of pain      Bed Mobility/Transfer  Bed Mobility: Supine to Sit: Standby Assist;Use of Rail  Transfer Type: Sit to Stand  Transfer: Assistance Level: From;Bed;Minimal Assist  Transfer: Assistive Device: Nurse, adult  Transfers: Type Of Assistance: Materials engineer;For Safety Considerations  Other Transfer Type: Stand to Sit  Other Transfer: Assistance Level: To;Bed Side Chair;Minimal Assist  End Of Activity Status: Up in Chair;Instructed Patient to Request Assist with Mobility;Instructed Patient to Use Call Light    Comments: Pt. continues to present with increased dizziness, nausea, SOA and HA in the upright positioning with subsequent increase in HR, highest HR 122, pulse regular 1+. Vitals monitored throughout, RUE BP with arm held at heart level for all BPs taken. Initially with a drop of >71mmHg into the seated position, but BP improved thereafter.    Balance  Sitting Balance: Static Sitting Balance;Standby Assist;2 UE Support  Standing Balance: Static Standing Balance;2 UE support;Minimal Assist    Gait  Gait Distance: 1 feet(3 steps to chair + 10 marching steps in place)  Gait: Assistance Level: Minimal Assist;of 2nd person;Safety Considerations  Gait: Assistive Device: Roller Walker  Comments: Good foot clearance with marching in place, but significant increase in SOA.  Activity Limited By: Complaint of Dizziness;Nausea;SOA      Vitals:  Supine HOB elevated: BP: 123/74 (85), HR: 102, spO2: 98%  Sitting EOB: BP: 109/73 (83), HR: 105-122  Sitting after performing ex: BP: 122/74 (80), HR: 104-117 (pulse regular 1+)  Standing: BP: 135/83 (87), HR: 106-110  Sitting - chair after transfer: BP: 140/70 (89), HR: 107-90      Activity/Exercise  Exercise: Supine;Seated;Active ROM  Exercise Repetitions: 10  Comments: Performed active ROM exercises in supine and sitting to assist with dizziness. Supine: ankle pumps, heel slides x 10, reported increased SOA with this activitiy, Seated: heel raises, long arc quads x 10    Education  Persons Educated: Patient  Patient Barriers To Learning: None Noted  Teaching Methods: Verbal Instruction;Demonstration  Patient Response: Verbalized Understanding;Return Demonstration  Topics: Plan/Goals of PT Interventions;Use of Assistive Device/Orthosis;Mobility Progression;Safety Awareness;Up with Assist Only;Recommend Continued Therapy  Comments: Recommend performing antithrombotic  exercises and gradual change in position, educated pt. on slow fdeep breathing with activity.  Pt. bearing down when using in haler, gavce instruction to inhale when pressing pump, hold and release breath.    Assessment/Progress  Impaired Mobility Due To: Decreased Activity Tolerance;Impaired Balance Assessment/Progress: Should Improve w/ Continued PT    Comments: Pt. continues to present with dizziness, nausea, headache and significant SOA with change in positions. Patiient's vitals monitored throughout session with an initial drop in SBP and increase HR (HR: 122). SBP recovered after initial drop.  Pt. does appear anxious and reports feeling anxious, he does feel this may be contributing to his current status.  His SOA is also apparent with any type of mobility. Patient has a moan as he is attempting to breath faster, encourage pt. to breath in through his nose and out through his mouth to move air.  SpO2 normal throughout session. Patient is not at baseline level and is not ready for discharge from a PT perspective, however pts.c main limitation to mobility is his dizziness, nausea, and SOA.  If symptoms improve likely mobility will follow suit.    AM-PAC 6 Clicks Basic Mobility Inpatient  Turning from your back to your side while in a flat bed without using bed rails: None  Moving from lying on your back to sitting on the side of a flatbed without using bedrails : None  Moving to and from a bed to a chair (including a wheelchair): A Little  Standing up from a chair using your arms (e.g. wheelchair, or bedside chair): A Little  To walk in hospital room: A Lot  Climbing 3-5 steps with a railing: A Lot  Raw Score: 18  Standardized (T-scale) Score: 41.05  Basic Mobility CMS 0-100%: 40.47  CMS G Code Modifier for Basic Mobility: CK    Goals  Goal Formulation: With Patient  Time For Goal Achievement: 5 days  Patient Will Go Supine To/From Sit: Independently  Patient Will Transfer Bed/Chair: Independently, Ongoing  Patient Will Transfer Sit to Stand: Independently, Ongoing  Patient Will Ambulate: Greater than 200 Feet, w/ Stand By Assist, Ongoing    Plan  Treatment Interventions: Mobility Training;Balance Activities;Endurance Training  PT Plan for Next Visit: Monitor vitals, progress ambulation distance, trial with chair follow    PT Discharge Recommendations  Recommendation: Currently patient requires inpatient level of care. However, typical progression for patient condition would anticipate home with assistance at time of discharge.  Patient Currently Requires Physical Assist With: Transfers;Ambulation  Patient Currently Requires Supervision For: Mobility  Patient Currently Requires Equipment: Dan Humphreys with wheels    Therapist: West Pugh, PT, DPT, CCS, GCS  Date: 02/15/2019

## 2019-02-16 MED ORDER — FLUOXETINE 20 MG PO CAP
20 mg | Freq: Every day | ORAL | 0 refills | Status: DC
Start: 2019-02-16 — End: 2019-02-19
  Administered 2019-02-16 – 2019-02-19 (×4): 20 mg via ORAL

## 2019-02-16 NOTE — Case Management (ED)
Request for Benefits    Received request from Abbie Dearing, [facility] to check IPR benefits for patient.    IPR Benefits:  Pt. Is covered at 100%  Annual $6,000  Applied $6,000   No specific limit on days for IPR         Call Reference # [id number]    [facility]  Case Management Assistant    For further assistance please contact [place]

## 2019-02-16 NOTE — Progress Notes
PHYSICAL THERAPY  PROGRESS NOTE        Name: Charles Vaughan        MRN: [id number]          DOB: May 27, 1956          Age: 63 y.o.  Admission Date: 02/07/2019             LOS: 9 days        Mobility  Patient Turn/Position: Weight shifted (Bed)  Progressive Mobility Level: Active transfer to chair  Level of Assistance: Assist X1  Assistive Device: Walker  Time Tolerated: 11-30 minutes  Activity Limited By: Fatigue;Weakness    Subjective  Significant hospital events: 63 yo M with PMH of HTN and drug use presents to ER with anxiety and abdominal pain. Patient is a poor historian. Reports epigastric abdominal pain for last 3 days. He took Oxycontin today without relief. Per EMS, patient is a known crack cocaine user. Patient admits to last using 2 days ago. Reports difficulty urinating as well as nausea. Denies vomiting, diarrhea, constipation, melena, hematochezia. Patient states headache with blurred vision as well as chest pain and shortness of breath. COVID + 6/13 although pt reports testing positive 4-5 weeks ago as well.   Mental / Cognitive Status: Alert;Oriented;Cooperative;Follows Commands  Pain: Patient has no complaint of pain  Ambulation Assist: Independent Mobility in [employer] without Device  Patient Owned Equipment: None  Home Situation: Lives wth Roommate  Type of Home: House  Entry Stairs: No Stairs  In-Home Stairs: No Stairs  Comments: Patient reports history of falls over the last 3 months although increased dizziness/falls over the last week especially. Pt reports he does not use walker. Of note, pt somewhat tangential and poor historian, initially disoriented to place although after further conversation appears more oriented.     Bed Mobility/Transfer  Bed Mobility: Supine to Sit: Standby Assist;Use of Rail  Bed Mobility: Sit to Supine: Standby Assist;Bed Flat  Transfer Type: Sit to/from Stand  Transfer: Assistance Level: Bed;Minimal Assist;To/From  Transfer: Assistive Device: Nurse, adult Transfers: Type Of Assistance: Materials engineer;For Safety Considerations  End Of Activity Status: Instructed Patient to Request Assist with Mobility;Instructed Patient to Use Call Light;In Bed;Nursing Notified    Balance  Sitting Balance: Static Sitting Balance;Standby Assist;2 UE Support  Standing Balance: Static Standing Balance;2 UE support;Minimal Assist    Activity/Exercise  Sit Edge Of Bed: 10 minutes  Sit Edge Of Bed Assist: Stand By Assist;Verbal Cues  Stand At Bedside : 15 minutes  Stand At Bedside Assist: Minimal Assist;Verbal Cues  Balance Exercise: Seated;Standing;Minimal Assist(sit to/from stand without UE use )    Education  Persons Educated: Patient  Patient Barriers To Learning: None Noted  Teaching Methods: Verbal Instruction;Demonstration  Patient Response: Verbalized Understanding;Return Demonstration  Topics: Plan/Goals of PT Interventions;Use of Assistive Device/Orthosis;Mobility Progression;Safety Awareness;Up with Assist Only;Recommend Continued Therapy    Assessment/Progress  Impaired Mobility Due To: Decreased Activity Tolerance;Impaired Balance  Assessment/Progress: Should Improve w/ Continued PT     Comments: Therapy session focused on sitting and standing balance. Patient perseverated on feeling anxious, that causes him to have shortness of breath, and then that causes him to feel dizzy. Patient comments that he thinks that he needs his anxiety medications adjusted because that is what is limiting him. While standing edge of bed, patient requires x2 UE use to maintain standing balance. Patient presents with decreased righting response and over correcting when losing balance. Patient completed sit to/from stand from bed height without UE use  and requires assistance to prevent from over correcting and falling anteriorly. Patient comments that he feels like he is in an alternate universe and that's typically how he feels when he's having an anxiety attack. AM-PAC 6 Clicks Basic Mobility Inpatient  Turning from your back to your side while in a flat bed without using bed rails: None  Moving from lying on your back to sitting on the side of a flatbed without using bedrails : None  Moving to and from a bed to a chair (including a wheelchair): A Little  Standing up from a chair using your arms (e.g. wheelchair, or bedside chair): A Little  To walk in hospital room: A Lot  Climbing 3-5 steps with a railing: A Lot  Raw Score: 18  Standardized (T-scale) Score: 41.05  Basic Mobility CMS 0-100%: 40.47  CMS G Code Modifier for Basic Mobility: CK    Goals  Goal Formulation: With Patient  Time For Goal Achievement: 5 days  Patient Will Go Supine To/From Sit: Independently  Patient Will Transfer Bed/Chair: Independently, Ongoing  Patient Will Transfer Sit to Stand: Independently, Ongoing  Patient Will Ambulate: Greater than 200 Feet, w/ Stand By Assist, Ongoing    Plan  Treatment Interventions: Mobility Training;Balance Activities;Endurance Training  Plan Frequency: 5 Days per Week  PT Plan for Next Visit: standing balance, progress ambulation when able,     PT Discharge Recommendations  Recommendation: Currently patient requires inpatient level of care. However, typical progression for patient condition would anticipate home with assistance at time of discharge.  Patient Currently Requires Physical Assist With: Transfers;Ambulation  Patient Currently Requires Supervision For: Mobility  Patient Currently Requires Equipment: Walker with wheels    Therapist: Romana Juniper, PT  Date: 02/16/2019

## 2019-02-16 NOTE — Progress Notes
General Progress Note    Name:  Charles Vaughan   Today's Date:  02/16/2019  Admission Date: 02/07/2019  LOS: 9 days                     Assessment/Plan:      21M w/ CAD s/p PCI (05/2018), HTN, HLD, polysubstance abuse (cocaine, etoh), presented to ED w/ 3d of epigastric pain, subjective fevers, dyspnea, poor PO intake, w/ weakness/ dizziness/ falls, in setting of recent COVID-19 pna (4-5wks ago; +SARS-CoV2 on 06/13 but pt asymptomatic. No indication for isolation). On arrival, noted to have hyponatremia (Na 121) w/ AG, respiratory alkalosis, and UDS positive for cocaine. Pt admitted for further mgmt.     Dizziness, improving  Falls  - Orthostatics negative initially, but repeated 06/16 due to worsening/persistent sx  - Slowed/abnormal finger to nose in b/l UES, no nystagmus on exam, normal heel to shin, no focal deficits  - PT also evaluated at bedside  - Patient notes multiple falls at home and does not feels safe going home at this time  - He does note history of spinal surgery when he was a teenager  - 06/15 MRI head/neck: MRI brain unremarkable. 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 There is also foraminal stenosis which is limited in estimation of severity due to artifact  - Echo unremarkable  Plan:  - orhtostatic hypotension mgmt below  - PT/OT still recommends inpatient  -Ordered rehab consult and will encourage to continue working with PT  -Ordred EKG and trop for chest pain which came back unremarkable  ???  Abdominal pain- resolved  -Onset 3 days ago  -Lower abdomen radiating upwards  -7 out of 10 pain, constant  -Some improvement with morphine  -No change with food  -Lactic acid 1.7  -CT abdomen pelvis with contrast  ???????????????????????????????????????-No radiopaque gallstones, no bile ductal dilatation, unremarkable kidney and ureters, unremarkable pancreas  ???????????????????????????????????????-Reactive periportal lymph nodes ???????????????????????????????????????-Abdominal aorta and iliac arteries normal with moderate plaque  ???????????????????????????????????????-No ascites/free air, no large or small bowel loop abnormalities  ???????????????????????????????????????-Bladder is partially distended, prostate unremarkable  -CTA chest  ???????????????????????????????????????-No evidence of acute segmental pulmonary embolism  ???????????????????????????????????????-Peripheral pulmonary arteries limited by motion  ???????????????????????????????????????-Mild bronchial wall thickening, no pneumonia  -Has no obvious reason for peritonitis, lactic acid 1.7 is against ischemia  -CT abdomen pelvis done with contrast which is not ideal to evaluate abdominal aortic dissection.??????While patient is at high risk due to cocaine use it is less likely that has been ongoing for 3 days and BP has been stable throughout his ED visit  -Could be related to bladder spasms and urinary retention  Plan:  - Continue to monitor  - Foley removed and no evidence of retention thus far. Had been started on flomax for retention, however, stopped on 06/16 due to symptomatic orthostatic hypotension  - zofran, maalox for abd discomfort  ???  Anion gap metabolic acidosis, resolved  Respiratory alkalosis, resolved  -Serum Osm???250,???suggesting that there are no other alcohols causing Gap  -Poor p.o. intake over the last few weeks worse over the last few days  -Suspect that anion gap is result of starvation ketosis  -Suspect that respiratory alkalosis is secondary to hyperventilation from abdominal pain???vs anxiety  -Trop neg x3  -EKG sinus tach, HR 114, twi V2, no ST changes, significant artifact  Plan:  - Monitor BMP  ???  Hyponatremia, resolved  - Urine  Osm 64, urine sodium less than 10  - Serum Osm 250,???specific gravity 1.002  - Consistent with???ADH independent cause  - Less likely psychogenic polydipsia or renal failure  - More likely a result of poor solute intake  - Could also be related to urinary retention  PLAN:  - Monitor BMP  ???  Urinary retention  - Foley placed in ED, 700 cc out - Foley removed 6/15 with no evidence of retention  - Continue Tamsulosin  ???  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   -Tamsulosin discontinued as well    Alcohol use disorder   Cocaine use  - UDS positive for cocaine  - Denies any recent alcohol use    Anxiety/depression  -restart prozac 20 mg daily.  Will look into restarting rest of psych medication based on symptoms  ???  COVID positive  - Reportedly tested positive for COVID-19???4 to 5 weeks ago then negative on repeat.  - Repeat testing 6/13 positive. Discussed with IPAC who notes okay to remove patient from isolation.  ???    DVT ppx: lovenox    Fluids: n/a  Electrolytes: monitor daily  Nutrition: cardiac  PT/OT: following      Dispo: Discussed with social work and apparently he does not have any more skilled days remaining.  He is not at the point where he can discharge home without inpatient level of assistance.however after a short time working with PT in house he will most likely be able to discharge home without need for inpatient assistance.  -Continue inpatient physical therapy.    Staff name:  Aman Bonet, DO Date:  02/16/2019   Med-private Team E  Team Pager [phone]  ________________________________________________________________________    Subjective    No acute events overnight.  He endorses some dizziness and galt instability which continues.  Denies chest pain, shortness of air, nausea or emesis.  He had anxiety with chest pain today.    Medications  Scheduled Meds:ascorbic acid (VITAMIN C) tablet 250 mg, 250 mg, Oral, QDAY aspirin chewable tablet 81 mg, 81 mg, Oral, QDAY  atorvastatin (LIPITOR) tablet 40 mg, 40 mg, Oral, QDAY  clopiDOGrel (PLAVIX) tablet 75 mg, 75 mg, Oral, QDAY  enoxaparin (LOVENOX) syringe 30 mg, 30 mg, Subcutaneous, BID  FLUoxetine (PROzac) capsule 20 mg, 20 mg, Oral, QDAY  pantoprazole DR (PROTONIX) tablet 40 mg, 40 mg, Oral, QDAY(21)  QUEtiapine (SEROquel) tablet 200 mg, 200 mg, Oral, QHS  vitamins, multi w/minerals tablet 1 tablet, 1 tablet, Oral, QDAY    Continuous Infusions:  PRN and Respiratory Meds:acetaminophen Q4H PRN, aluminum/magnesium hydroxide TID w/ meals PRN, benzonatate TID PRN, clonazePAM BID PRN, clonazePAM QHS PRN, ipratropium/albuterol PRN, melatonin QHS PRN, ondansetron (ZOFRAN) IV Q6H PRN        Objective:                          Vital Signs: Last Filed                 Vital Signs: 24 Hour Range   BP: 150/83 (06/22 0957)  Temp: 36.6 ???C (97.8 ???F) (06/22  1191)  Pulse: 102 (06/22 1100)  Respirations: 18 PER MINUTE (06/22 1100)  SpO2: 97 % (06/22 1100)  SpO2 Pulse: 109 (06/21 1549) BP: (109-150)/(70-85)   Temp:  [36.5 ???C (97.7 ???F)-36.8 ???C (98.2 ???F)]   Pulse:  [79-107]   Respirations:  [18 PER MINUTE]   SpO2:  [96 %-99 %]    Intensity Pain Scale (Self Report): 5 (02/16/19 0851)  Intensity Pain Scale (Self Report): 5 (02/16/19 0851) Vitals:    02/07/19 0524 02/09/19 1400 02/13/19 1019   Weight: 75 kg (165 lb 4.8 oz) 76.4 kg (168 lb 6.4 oz) 76.2 kg (168 lb)       Intake/Output Summary:  (Last 24 hours)    Intake/Output Summary (Last 24 hours) at 02/16/2019 1347  Last data filed at 02/16/2019 4782  Gross per 24 hour   Intake 222 ml   Output 550 ml   Net -328 ml      Stool Occurrence: 0      Physical exam:  General: NAD  HEENT: Normocephalic and atraumatic.  Moist Mucous membranes.  Pupils are equal, round and reactive to light. No discharge. No scleral icterus. Missing several teeth  Cardiovascular: bradycardic, regular rhythm, no murmurs. Pulmonary/Chest: Effort normal and breath sounds normal. No respiratory distress. No wheezes or crackles  Abdominal: Soft, nondistended, nontender, and no mass. Bowel sounds are normal.  Ext:  No pitting edema in extremities. Nml distal pulses  Neurological: A&Ox3, no focal findings  Skin: Warm and dry. No rash noted. No erythema. No pallor.    Lab/Imaging Reviewed  24-hour labs:    Results for orders placed or performed during the hospital encounter of 02/07/19 (from the past 24 hour(s))   TROPONIN-I    Collection Time: 02/16/19  9:17 AM   Result Value Ref Range    Troponin-I 0.00 0.0 - 0.05 NG/ML

## 2019-02-17 MED ORDER — MULTIVIT-IRON-FA-CALCIUM-MINS 9 MG IRON-400 MCG PO TAB
1 | ORAL_TABLET | Freq: Every day | ORAL | 1 refills | Status: CN
Start: 2019-02-17 — End: ?

## 2019-02-17 NOTE — Care Plan
Problem: Infection, Risk of  Goal: Absence of infection  Outcome: Goal Ongoing  Goal: Knowledge of Infection Control Procedures  Outcome: Goal Ongoing     Problem: Falls, High Risk of  Goal: Absence of falls-Adult Patient  Outcome: Goal Ongoing     Problem: Mobility/Activity Intolerance  Goal: Maximize functional ADL's and mobility outcomes  Outcome: Goal Ongoing     Problem: Pain  Goal: Management of pain  Outcome: Goal Ongoing  Goal: Knowledge of pain management  Outcome: Goal Ongoing  Goal: Progress Toward Pain Management Goals  Outcome: Goal Ongoing     Problem: Skin Integrity  Goal: Skin integrity intact  Outcome: Goal Ongoing  Goal: Healing of skin (Pressure Injury)  Outcome: Goal Ongoing     Problem: Anxiety  Goal: Alleviation of anxiety  Outcome: Goal Ongoing     Problem: Respiratory Impairment (Non-Ventilated Patient)  Goal: Effective gas exchange  Outcome: Goal Ongoing  Goal: Effective breathing pattern  Outcome: Goal Ongoing  Goal: Patent airway  Outcome: Goal Ongoing     Problem: Nutrition Deficit  Goal: Adequate nutritional intake  Outcome: Goal Ongoing  Goal: Body weight within specified parameters  Outcome: Goal Ongoing     Problem: Discharge Planning  Goal: Participation in plan of care  Outcome: Goal Ongoing  Goal: Knowledge regarding plan of care  Outcome: Goal Ongoing  Goal: Prepared for discharge  Outcome: Goal Ongoing

## 2019-02-17 NOTE — Progress Notes
RT Adult Assessment Note    NAME:Charles Vaughan             MRN: [id number]             DOB:March 17, 1956          AGE: 63 y.o.  ADMISSION DATE: 02/07/2019             DAYS ADMITTED: LOS: 10 days    RT Treatment Plan:  Protocol Plan: Medications  Combivent Respimat: (combivent PRN)    Protocol Plan: Procedures  Comment: Combivent PRN    Additional Comments:  Impressions of the patient: alert, no signs of distress.  Intervention(s)/outcome(s): rt eval  Patient education that was completed: none  Recommendations to the care team: none    Vital Signs:  Pulse: 88  RR: 16 PER MINUTE  SpO2: 98 %  O2 Device:    Liter Flow:    O2%: 21 %  Breath Sounds: Clear (Implies normal);Decreased  Respiratory Effort: Non-Labored

## 2019-02-17 NOTE — Progress Notes
PHYSICAL THERAPY  PROGRESS NOTE        Name: Charles Vaughan        MRN: [id number]          DOB: [birth year]          Age: 63 y.o.  Admission Date: 02/07/2019             LOS: 10 days        Mobility  Patient Turn/Position: Chair  Progressive Mobility Level: Walk in hallway  Distance Walked (feet): 175 ft(+25)  Level of Assistance: Assist X1  Assistive Device: Walker  Time Tolerated: 11-30 minutes  Activity Limited By: Fatigue;Weakness    Subjective  Significant hospital events: 63 yo M with PMH of HTN and drug use presents to ER with anxiety and abdominal pain. Patient is a poor historian. Reports epigastric abdominal pain for last 3 days. He took Oxycontin today without relief. Per EMS, patient is a known crack cocaine user. Patient admits to last using 2 days ago. Reports difficulty urinating as well as nausea. Denies vomiting, diarrhea, constipation, melena, hematochezia. Patient states headache with blurred vision as well as chest pain and shortness of breath. COVID + 6/13 although pt reports testing positive 4-5 weeks ago as well.   Mental / Cognitive Status: Alert;Oriented;Cooperative;Follows Commands  Pain: Patient complains of pain;After activity  Pain Location: Headache  Pain Description: Aching  Pain Interventions: Patient agrees to participate in therapy  Ambulation Assist: Independent Mobility in Community without Device  Patient Owned Equipment: None  Home Situation: Lives wth Roommate  Type of Home: House  Entry Stairs: No Stairs  In-Home Stairs: No Stairs  Comments: Patient reports history of falls over the last 3 months although increased dizziness/falls over the last week especially. Pt reports he does not use walker. Of note, pt somewhat tangential and poor historian, initially disoriented to place although after further conversation appears more oriented.     Bed Mobility/Transfer  Bed Mobility: Supine to Sit: Standby Assist;Use of Rail  Transfer Type: Sit to/from Stand Transfer: Assistance Level: Bed;To/From;Minimal Assist;Standby Assist(initial sit to stand minimal assist to obtain balance)  Transfer: Assistive Device: Nurse, adult  Transfers: Type Of Assistance: Materials engineer;For Safety Considerations  End Of Activity Status: Instructed Patient to Request Assist with Mobility;Instructed Patient to Use Call Light;Nursing Notified;Up in Chair    Balance  Sitting Balance: Static Sitting Balance;Standby Assist;2 UE Support  Standing Balance: Static Standing Balance;2 UE support;Minimal Assist(contact guard assist)    Gait  Gait Distance: 175 feet(+25)  Gait: Assistance Level: Minimal Assist;Safety Considerations(contact guard assist)  Gait: Assistive Device: Roller Walker  Gait: Descriptors: Decreased foot clearance RLE;Decreased foot clearance LLE;Loss of balance;Decreased step length;Variable step length;Pathway deviations;Pace: Slow  Comments: increased B shoulder elevation, increase weight bearing in UE, 1 B knee buckling noted, distractible     Education  Persons Educated: Patient  Patient Barriers To Learning: None Noted  Teaching Methods: Verbal Instruction;Demonstration  Patient Response: Verbalized Understanding;Return Demonstration  Topics: Plan/Goals of PT Interventions;Use of Assistive Device/Orthosis;Mobility Progression;Safety Awareness;Up with Assist Only;Recommend Continued Therapy    Assessment/Progress  Impaired Mobility Due To: Decreased Activity Tolerance;Impaired Balance  Assessment/Progress: Should Improve w/ Continued PT   Comments: Patient's demeanor increased compared to previous therapy session. Patient appears less anxious and really wanting to progress ambulation with therapy. Patient very talkative during therapy session. 1 loss of balance initially upon standing from bed but completed several times after with no loss of balance. Patient presents with slight instability during ambulation  but no physical assistance provided to maintain balance. Patient complains of slight headache at end of therapy session but minimal dizziness as long as patient looked down at the ground during ambulation.     AM-PAC 6 Clicks Basic Mobility Inpatient  Turning from your back to your side while in a flat bed without using bed rails: None  Moving from lying on your back to sitting on the side of a flatbed without using bedrails : None  Moving to and from a bed to a chair (including a wheelchair): A Little  Standing up from a chair using your arms (e.g. wheelchair, or bedside chair): A Little  To walk in hospital room: A Lot  Climbing 3-5 steps with a railing: A Lot  Raw Score: 18  Standardized (T-scale) Score: 41.05  Basic Mobility CMS 0-100%: 40.47  CMS G Code Modifier for Basic Mobility: CK    Goals  Goal Formulation: With Patient  Time For Goal Achievement: 5 days  Patient Will Go Supine To/From Sit: Independently  Patient Will Transfer Bed/Chair: Independently, Ongoing  Patient Will Transfer Sit to Stand: Independently, Ongoing  Patient Will Ambulate: Greater than 200 Feet, w/ Stand By Assist, Ongoing    Plan  Treatment Interventions: Mobility Training;Balance Activities;Endurance Training  Plan Frequency: 5 Days per Week  PT Plan for Next Visit: standing balance, progress ambulation when able,     PT Discharge Recommendations  Recommendation: Home with consistent supervision/assistance  Recommendation for Therapy Post Discharge: Home health  Patient Currently Requires Physical Assist With: Transfers;Ambulation  Patient Currently Requires Supervision For: Mobility  Patient Currently Requires Equipment: Walker with wheels   Patient requires the use of a walker with wheels to complete ADL???s in the home including meal preparation, ambulation the bathroom for toileting, bathing and grooming, and safe home mobility.  Patient is unable to complete these ADL???s with a cane or crutch and can safely use the walker.       Therapist: Romana Juniper, PT Date: 02/17/2019

## 2019-02-18 ENCOUNTER — Encounter: Admit: 2019-02-18 | Discharge: 2019-02-18

## 2019-02-18 MED ORDER — KETOROLAC 15 MG/ML IJ SOLN
15 mg | Freq: Once | INTRAVENOUS | 0 refills | Status: CP
Start: 2019-02-18 — End: ?
  Administered 2019-02-18: 14:00:00 15 mg via INTRAVENOUS

## 2019-02-18 MED ORDER — MAGNESIUM SULFATE IN D5W 1 GRAM/100 ML IV PGBK
1 g | INTRAVENOUS | 0 refills | Status: CP
Start: 2019-02-18 — End: ?
  Administered 2019-02-18 (×2): 1 g via INTRAVENOUS

## 2019-02-18 MED ORDER — BENZONATATE 100 MG PO CAP
100 mg | ORAL_CAPSULE | Freq: Three times a day (TID) | ORAL | 0 refills | 9.00000 days | Status: DC | PRN
Start: 2019-02-18 — End: 2019-02-18
  Filled 2019-02-19: qty 20, 7d supply, fill #1

## 2019-02-18 MED ORDER — BENZONATATE 100 MG PO CAP
100 mg | ORAL_CAPSULE | Freq: Three times a day (TID) | ORAL | 0 refills | 9.00000 days | Status: DC | PRN
Start: 2019-02-18 — End: 2019-06-11

## 2019-02-18 MED ORDER — PROCHLORPERAZINE EDISYLATE 5 MG/ML IJ SOLN
10 mg | INTRAVENOUS | 0 refills | Status: DC | PRN
Start: 2019-02-18 — End: 2019-02-19
  Administered 2019-02-18: 14:00:00 10 mg via INTRAVENOUS

## 2019-02-18 MED ORDER — CLONAZEPAM 0.5 MG PO TAB
.5 mg | Freq: Once | ORAL | 0 refills | Status: CP
Start: 2019-02-18 — End: ?
  Administered 2019-02-18: 22:00:00 0.5 mg via ORAL

## 2019-02-18 NOTE — Progress Notes
General Progress Note    Name:  Charles Vaughan   Today's Date:  02/18/2019  Admission Date: 02/07/2019  LOS: 11 days                     Assessment/Plan:      48M w/ CAD s/p PCI (05/2018), HTN, HLD, polysubstance abuse (cocaine, etoh), presented to ED w/ 3d of epigastric pain, subjective fevers, dyspnea, poor PO intake, w/ weakness/ dizziness/ falls, in setting of recent COVID-19 pna (4-5wks ago; +SARS-CoV2 on 06/13 but pt asymptomatic. No indication for isolation). On arrival, noted to have hyponatremia (Na 121) w/ AG, respiratory alkalosis, and UDS positive for cocaine. Pt admitted for further mgmt.     Dizziness, improving  Falls  - Orthostatics negative initially, but repeated 06/16 due to worsening/persistent sx  - Slowed/abnormal finger to nose in b/l UES, no nystagmus on exam, normal heel to shin, no focal deficits  - PT also evaluated at bedside  - Patient notes multiple falls at home and does not feels safe going home at this time  - He does note history of spinal surgery when he was a teenager  - 06/15 MRI head/neck: MRI brain unremarkable. 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 There is also foraminal stenosis which is limited in estimation of severity due to artifact  - Echo unremarkable  Plan:  - orhtostatic hypotension mgmt below  - PT/OT still recommended inpatient but now is recommending home with supervision vs home health.  -He was accepted to [facility] and [employer] authorization is pending  -Ordered rehab consult and will encourage to continue working with PT recs pending    ???  Abdominal pain- resolved  -Onset 3 days ago  -Lower abdomen radiating upwards  -7 out of 10 pain, constant  -Some improvement with morphine  -No change with food  -Lactic acid 1.7  -CT abdomen pelvis with contrast  ???????????????????????????????????????-No radiopaque gallstones, no bile ductal dilatation, unremarkable kidney and ureters, unremarkable pancreas  ???????????????????????????????????????-Reactive periportal lymph nodes  ???????????????????????????????????????-Abdominal aorta and iliac arteries normal with moderate plaque  ???????????????????????????????????????-No ascites/free air, no large or small bowel loop abnormalities  ???????????????????????????????????????-Bladder is partially distended, prostate unremarkable  -CTA chest  ???????????????????????????????????????-No evidence of acute segmental pulmonary embolism  ???????????????????????????????????????-Peripheral pulmonary arteries limited by motion  ???????????????????????????????????????-Mild bronchial wall thickening, no pneumonia  -Has no obvious reason for peritonitis, lactic acid 1.7 is against ischemia  -CT abdomen pelvis done with contrast which is not ideal to evaluate abdominal aortic dissection.??????While patient is at high risk due to cocaine use it is less likely that has been ongoing for 3 days and BP has been stable throughout his ED visit  -Could be related to bladder spasms and urinary retention  Plan:  - Continue to monitor  - Foley removed and no evidence of retention thus far. Had been started on flomax for retention, however, stopped on 06/16 due to symptomatic orthostatic hypotension  - zofran, maalox for abd discomfort  ???  Anion gap metabolic acidosis, resolved  Respiratory alkalosis, resolved  -Serum Osm???250,???suggesting that there are no other alcohols causing Gap  -Poor p.o. intake over the last few weeks worse over the last few days  -Suspect that anion gap is result of starvation ketosis  -Suspect that respiratory alkalosis is secondary to hyperventilation from abdominal pain???vs anxiety  -Trop neg x3  -EKG sinus tach, HR 114, twi V2, no ST changes,  significant artifact  Plan:  - Monitor BMP  ???  Hyponatremia, resolved  - Urine Osm 64, urine sodium less than 10  - Serum Osm 250,???specific gravity 1.002  - Consistent with???ADH independent cause  - Less likely psychogenic polydipsia or renal failure  - More likely a result of poor solute intake  - Could also be related to urinary retention  PLAN: - Monitor BMP  ???  Urinary retention  - Foley placed in ED, 700 cc out  - Foley removed 6/15 with no evidence of retention  - Continue Tamsulosin  ???  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   -Tamsulosin discontinued as well    Alcohol use disorder   Cocaine use  - UDS positive for cocaine  - Denies any recent alcohol use    Anxiety/depression  -restart prozac 20 mg daily.  Will look into restarting rest of psych medication based on symptoms  ???  COVID positive  - Reportedly tested positive for COVID-19???4 to 5 weeks ago then negative on repeat.  - Repeat testing 6/13 positive. Discussed with IPAC who notes okay to remove patient from isolation.  ???    DVT ppx: lovenox    Fluids: n/a  Electrolytes: monitor daily  Nutrition: cardiac  PT/OT: following      Dispo: Continue inpatient  Discharge once insurance authorization goes through.    Staff name:  Lasheka Kempner, DO Date:  02/18/2019   Med-private Team E  Team Pager [phone]  ________________________________________________________________________    Subjective    No acute events overnight. He has had better mobility yesterday but somewhat worse today.  Appears to wax and wane.  Denies fevers, chills, chest pain or shortness of air.    Medications  Scheduled Meds:ascorbic acid (VITAMIN C) tablet 250 mg, 250 mg, Oral, QDAY  aspirin chewable tablet 81 mg, 81 mg, Oral, QDAY  atorvastatin (LIPITOR) tablet 40 mg, 40 mg, Oral, QDAY  clopiDOGrel (PLAVIX) tablet 75 mg, 75 mg, Oral, QDAY  enoxaparin (LOVENOX) syringe 30 mg, 30 mg, Subcutaneous, BID  FLUoxetine (PROzac) capsule 20 mg, 20 mg, Oral, QDAY pantoprazole DR (PROTONIX) tablet 40 mg, 40 mg, Oral, QDAY(21)  QUEtiapine (SEROquel) tablet 200 mg, 200 mg, Oral, QHS  vitamins, multi w/minerals tablet 1 tablet, 1 tablet, Oral, QDAY    Continuous Infusions:  PRN and Respiratory Meds:acetaminophen Q4H PRN, aluminum/magnesium hydroxide TID w/ meals PRN, benzonatate TID PRN, clonazePAM BID PRN, clonazePAM QHS PRN, ipratropium/albuterol PRN, melatonin QHS PRN, ondansetron (ZOFRAN) IV Q6H PRN, prochlorperazine Q6H PRN        Objective:                          Vital Signs: Last Filed                 Vital Signs: 24 Hour Range   BP: 142/71 (06/24 0935)  Temp: 36.4 ???C (97.6 ???F) (06/24 0935)  Pulse: 103 (06/24 0935)  Respirations: 18 PER MINUTE (06/24 0935)  SpO2: 97 % (06/24 0935) BP: (119-142)/(66-73)   Temp:  [36.4 ???C (97.5 ???F)-36.8 ???C (98.2 ???F)]  Pulse:  [78-112]   Respirations:  [18 PER MINUTE]   SpO2:  [93 %-97 %]    Intensity Pain Scale (Self Report): 6 (02/18/19 0820)  Intensity Pain Scale (Self Report): 5 (02/17/19 1941) Vitals:    02/07/19 0524 02/09/19 1400 02/13/19 1019   Weight: 75 kg (165 lb 4.8 oz) 76.4 kg (168 lb 6.4 oz) 76.2 kg (168 lb)       Intake/Output Summary:  (Last 24 hours)    Intake/Output Summary (Last 24 hours) at 02/18/2019 1345  Last data filed at 02/18/2019 1145  Gross per 24 hour   Intake 0 ml   Output 1325 ml   Net -1325 ml      Stool Occurrence: 1      Physical exam:  General: NAD  HEENT: Normocephalic and atraumatic.  Moist Mucous membranes.  Pupils are equal, round and reactive to light. No discharge. No scleral icterus. Missing several teeth  Cardiovascular: bradycardic, regular rhythm, no murmurs.   Pulmonary/Chest: Effort normal and breath sounds normal. No respiratory distress. No wheezes or crackles  Abdominal: Soft, nondistended, nontender, and no mass. Bowel sounds are normal.  Ext:  No pitting edema in extremities. Nml distal pulses  Neurological: A&Ox3, no focal findings Skin: Warm and dry. No rash noted. No erythema. No pallor.    Lab/Imaging Reviewed  24-hour labs:    No results found for this visit on 02/07/19 (from the past 24 hour(s)).

## 2019-02-18 NOTE — Consults
Physical Medicine & Rehabilitation Consult Service    Name: Charles Vaughan        MRN: [id number]          DOB: August 20, 1956          Age: 63 y.o.  Admission Date: 02/07/2019             LOS: 10 days        Date of Service: 02/18/2019  Financial Class: Payor: [employer]ET[employer] / Plan: [employer] CHOICE POS II / Product Type: *No Product type* /   Referring Physician: Zhidovetskiy, Dmitriy, DO  Reason for Consult: evaluate for Post-Acute Rehab/Placement  Precautions: Fall,    Weight Bearing Precautions: wbat      Active Problems  Patient Active Problem List    Diagnosis Date Noted   ??? Abdominal pain 02/07/2019   ??? Encephalopathy acute 10/02/2018   ??? Hallucination 10/02/2018   ??? COPD exacerbation (HCC) 10/02/2018   ??? Non compliance w medication regimen 10/02/2018   ??? Acute coronary syndrome (HCC) 06/18/2018   ??? Chest pain 06/09/2018     ??? Gait abnormality   ??? Impaired mobility/ADLs   ??? Impaired transfers       Assessment & Plan       Charles Vaughan is a 63 y.o. male admitted to [facility] on 02/07/2019 with the following issues:    debility due to COVID-19 infection    Recommendations:  Post-acute care rehabilitation needs:   Patient has medical complexities and currently has functional deficits with rehabilitative needs to warrant a short stay at acute inpatient rehabilitation pending updated OT note (has not had an OT evaluation during this admission), but he may make functional progress during current acute admission for safe discharge home.        Impairments: loss of coordination, pain, poor activity tolerance, sensory loss and weakness  Activity Limitations:  grooming, bathing, dressing - upper, dressing - lower, toileting, transfers, ambulation and stairs  Participation Restrictions: unable to return home safely  Family / Patient Dispositional Goals: return home to previous level of function    Overall Functional Goals  Gait and mobility Mod I   Transfers Mod I    ADLs Mod I Cognition / Communication Cognition grossly intact and Speech intact        Barriers/Facilitators:  Barriers: Caregiver apprehension, Financial resources and High burden of care  Facilitators: patient motivation, improving strength / endurance and improving medical condition  Rehabilitation Prognosis: Fair    Tolerance for three hours of therapy a day: Good       PT, OT consulted to address deficits as below     Impaired gait/mobility:  PTA pt was independent at community level without assistive device  Will benefit from continued work with PT to address mobility deficits     Impaired ADL:  PTA pt was independent  Will benefit from ongoing OT to address functional deficits    Meralgia paresthetica on the left:  Recommended loose fitting clothing, no belts, and maintain healthy weight.  Could consider capsaicin topical cream      Thank you for this consultation.  Please call our consult pager with questions or concerns.     Sharlotte Alamo Danie Chandler, MD  Rehab Consult Pager:  [phone]    History of Present Illness     Chief complaint: weakness    Hospital Course:   Charles Vaughan is a 63 y.o. male, with PMH listed below notable for coronary artery disease  status post PCI in October 2019, hypertension, hyperlipidemia, polysubstance abuse (cocaine and alcohol), who presented to the emergency department on 6/13 with 3 days of epigastric pain, subjective fevers, dyspnea, poor p.o. intake and weakness with dizziness and falls in the setting of a recent COVID-19 pneumonia 4 to 5 weeks ago.  He was COVID-19 positive again on 6/13 but asymptomatic.  He was found to be hyponatremic to 121 with respiratory alkalosis and anion gap and UDS positive for cocaine.    Prior admission, he lived independently with a friend and his girlfriend in [place] home.  He is not very confident that they would be able to provide him with consistent physical help, but may be able to provide intermittent supervision. OT not yet evaluated the patient. Pt is working with PT to address functional and mobility deficits, and rehab medicine is now consulted for post-acute rehab/placement recommendations.    Patient denies F/C/N/V/D/SOB/CP.  Endorses headache and weakness and earache     Medical History:   Diagnosis Date   ??? CAD (coronary artery disease)    ??? Hyperlipidemia         Surgical History:   Procedure Laterality Date   ??? ANGIOGRAPHY CORONARY ARTERY WITH LEFT HEART CATHETERIZATION N/A 06/13/2018    Performed by Laney Pastor, MD at [facility] CATH LAB   ??? PERCUTANEOUS CORONARY STENT PLACEMENT WITH ANGIOPLASTY N/A 06/13/2018    Performed by Laney Pastor, MD at [facility] CATH LAB        Social History     Socioeconomic History   ??? Marital status: Single     Spouse name: Not on file   ??? Number of children: Not on file   ??? Years of education: Not on file   ??? Highest education level: Not on file   Occupational History   ??? Not on file   Tobacco Use   ??? Smoking status: Former Smoker     Last attempt to quit: 06/09/2014     Years since quitting: 4.6   ??? Smokeless tobacco: Former Estate agent and Sexual Activity   ??? Alcohol use: Yes     Comment: states he drinks often but is unsure how often   ??? Drug use: Not Currently     Types: Cocaine     Comment: reports use 2 days ago    ??? Sexual activity: Not on file   Other Topics Concern   ??? Not on file   Social History Narrative   ??? Not on file     No family history on file.  Family history: noncontributory       Scheduled Meds:ascorbic acid (VITAMIN C) tablet 250 mg, 250 mg, Oral, QDAY  aspirin chewable tablet 81 mg, 81 mg, Oral, QDAY  atorvastatin (LIPITOR) tablet 40 mg, 40 mg, Oral, QDAY  clopiDOGrel (PLAVIX) tablet 75 mg, 75 mg, Oral, QDAY  enoxaparin (LOVENOX) syringe 30 mg, 30 mg, Subcutaneous, BID  FLUoxetine (PROzac) capsule 20 mg, 20 mg, Oral, QDAY  pantoprazole DR (PROTONIX) tablet 40 mg, 40 mg, Oral, QDAY(21)  QUEtiapine (SEROquel) tablet 200 mg, 200 mg, Oral, QHS vitamins, multi w/minerals tablet 1 tablet, 1 tablet, Oral, QDAY    Continuous Infusions:  PRN and Respiratory Meds:acetaminophen Q4H PRN, aluminum/magnesium hydroxide TID w/ meals PRN, benzonatate TID PRN, clonazePAM BID PRN, clonazePAM QHS PRN, ipratropium/albuterol PRN, melatonin QHS PRN, ondansetron (ZOFRAN) IV Q6H PRN       No Known Allergies    Prior Level of Function  Self-Care/ADLs:  Independent   Mobility: independent at community level without assistive device     Home Environment:  Home Situation: Lives wth Roommate (02/17/2019  1:50 PM)    Patient Owned Equipment: None (02/17/2019  1:50 PM)    Type of Home: House (02/17/2019  1:50 PM)    Entry Stairs: No Stairs (02/17/2019  1:50 PM)    In-Home Stairs: No Stairs (02/17/2019  1:50 PM)    Comments: Patient reports history of falls over the last 3 months although increased dizziness/falls over the last week especially. Pt reports he does not use walker. Of note, pt somewhat tangential and poor historian, initially disoriented to place although after further conversation appears more oriented.  (02/17/2019  1:50 PM)    No data recorded    Current Level Of Function:   PT Gait Distance: 175 feet(+25) Gait: Assistance Level: Minimal Assist, Safety Considerations(contact guard assist) Gait: Assistive Device: Roller Walker  Bed Mobility/Transfers  Bed Mobility: Supine to Sit: Standby Assist, Use of Rail  Bed Mobility: Sit to Supine: Standby Assist, Bed Flat  Comments: Pt reports increased dizziness upon sitting; subsides within a few minutes. Pt   Transfer Type: Sit to/from Stand  Transfer: Assistance Level: Bed, To/From, Minimal Assist, Standby Assist(initial sit to stand minimal assist to obtain balance)  Transfer: Assistive Device: Nurse, adult  Transfers: Type Of Assistance: Materials engineer, For Safety Considerations  Other Transfer Type: Stand to Sit  Other Transfer: Assistance Level: To, Bed Side Chair, Minimal Assist End Of Activity Status: Instructed Patient to Request Assist with Mobility, Instructed Patient to Use Call Light, Nursing Notified, Up in Chair  Comments: Pt. continues to present with increased dizziness, nausea, SOA and HA in the upright positioning with subsequent increase in HR, highest HR 122, pulse regular 1+. Vitals monitored throughout, RUE BP with arm held at heart level for all BPs taken. Initially with a drop of >9mmHg into the seated position, but BP improved thereafter.   OT     SLP COGNITIVE EVALUATION SUMMARY     PRAGMATICS:    BEHAVIOR:    AUDITORY COMPREHENSION:      ORIENTATION:    AUDITORY ATTENTION/WORKING MEMORY:    AUDITORY MEMORY/SUSTAINED ATTENTION:    NEW LEARNING:    SEQUENCING/ORGANIZATION:    PROBLEM SOLVING:    REASONING:      MATH/MONEY SKILLS:      VISUAL PERCEPTUAL:    SWALLOW EVALUATION SUMMARY                       Review of Systems     A 14 point review of systems was negative except for: that noted in the HPI    Physical Exam     BP: 119/67 (06/23 2245)  Temp: 36.8 ???C (98.2 ???F) (06/23 2245)  Pulse: 81 (06/23 2245)  Respirations: 18 PER MINUTE (06/23 2245)  SpO2: 93 % (06/23 2245)  Body mass index is 25.54 kg/m???.     Gen: awake, alert, NAD  HEENT: NCAT, EOMI, MMM, chipped tooth  Neck: Supple and symmetric  Heart: Extremities are well perfused  Lungs: respirations even and non-labored  Abdomen: Soft, non-distended  Psych: pleasant mood/appropriate affect  Ext: No c/c/e  MS:     Root Right Left   Shoulder abduction C5 4 5   Elbow Flexion C5 5 5   Wrist Extension C6 5 5   Elbow Extension C7 4 5   Finger Flexion C8 5 5   Finger Abduction  T1 5 5   Hip Flexion L2 5 5   Knee Extension L3 5 5   Dorsiflexion L4 5 5   EHL Extension  L5 5 5   Plantarflexion S1 5 5     Neuro:  Cranial Nerves Cranial Nerves 2-12 are grossly intact   DTR's no hyperreflexia   Babinski Downgoing Bilaterally   Hoffman Negative bilaterally   Upper Extremity Tone Normal   Lower Extremity Tone Normal Upper Extremity Sensation Intact to light touch bilaterally   Lower Extremity Sensation Intact to light touch bilaterally   Clonus Negative Bilaterally   Memory/Concentration  grossly intact       Intake/Output Summary (Last 24 hours) at 02/18/2019 0543  Last data filed at 02/17/2019 2000  Gross per 24 hour   Intake 666 ml   Output 1325 ml   Net -659 ml        Hematology:    Lab Results   Component Value Date    HGB 13.3 02/15/2019    HCT 38.6 02/15/2019    PLTCT 314 02/15/2019    WBC 5.9 02/15/2019    NEUT 51 02/15/2019    ANC 3.04 02/15/2019    ALC 1.59 02/15/2019    MONA 16 02/15/2019    AMC 0.94 02/15/2019    ABC 0.06 02/15/2019    MCV 81.3 02/15/2019    MCHC 34.4 02/15/2019    MPV 8.2 02/15/2019    RDW 15.7 02/15/2019   ,   Coagulation:  No results found for: PT, PTT, INR   General Chemistry:    Lab Results   Component Value Date    NA 139 02/15/2019    K 4.3 02/15/2019    CL 105 02/15/2019    GAP 7 02/15/2019    BUN 16 02/15/2019    CR 1.09 02/15/2019    GLU 77 02/15/2019    CA 9.3 02/15/2019    ALBUMIN 3.9 02/15/2019    MG 2.0 02/08/2019    TOTBILI 0.2 02/15/2019        Radiology:    Reviewed    Mri Head Wo Contrast    Result Date: 02/10/2019  EXAM: MRI BRAIN AND C-SPINE HISTORY: Dizziness. Vertigo. Assess for brainstem CVA. TECHNIQUE: Multiplanar and multisequence MR imaging of the head and C-spine was performed. This was done both before and after the administration of MultiHance contrast. COMPARISON: CT head February 07, 2019. FINDINGS: Brain: Limited evaluation of the posterior fossa and brainstem secondary to blooming artifact from posterior C1 arch metallic hardware. Repetitive patient motion also slightly limits examination. The ventricles and subarachnoid spaces are normal in size and configuration. Occasional scattered supratentorial white matter FLAIR hyperintensity. Brain parenchyma is normal in signal. There is no midline shift or mass effect. There is no area of abnormal contrast enhancement. The vascular flow-voids are unremarkable. Diffusion weighted imaging is not indicative of acute or recent infarct in the visualized portions of the head. Minimal left maxillary sinus mucosal thickening. C-spine: Limited evaluation of the upper cervical cord secondary to susceptibility artifact from posterior cervical spine cerclages fixation from C1 through C3. Repetitive patient motion also limits the examination. There is approximately 2 mm anterolisthesis at C4-C5 and C5-C6. The vertebral body heights are maintained. There is normal marrow signal. Visualized portions of the cervical cord are normal in signal. Moderate multilevel degenerative disease in the mid to lower cervical spine, greatest at C6-C7. C2-C3: Limited axial evaluation. Grossly unremarkable on sagittal imaging. C3-C4: Limited axial evaluation. No significant disc abnormality  on sagittal imaging. Lower limits of normal spinal canal AP diameter due to pedicles. C4-C5: Posterior disc osteophyte complex, uncovertebral and facet hypertrophy, and ligamentum flavum thickening results marked central spinal stenosis with loss of CSF signal surrounding the cord. There appears to be foraminal stenosis though estimation of severity is limited.Marland Kitchen C5-C6: Posterior disc osteophyte complex, uncovertebral and facet hypertrophy results in mild left neuroforaminal stenosis. C6-C7: Posterior disc osteophyte complex, uncovertebral and facet hypertrophy results in moderate bilateral neuroforaminal stenosis. C7-T1: Unremarkable. The paraspinous soft tissues are unremarkable.     Brain: Limited diffusion evaluation of portions of the brainstem and posterior fossa secondary to susceptibility from spinal hardware. The midbrain and upper pons have no diffusion abnormality. No FLAIR or T2 signal abnormality is identified within the remaining brainstem. C-spine: 1.  Limited evaluation of the upper cervical cord secondary to susceptibility artifact. 2.  Approximately 2 mm degenerative anterolisthesis at C4-C5 and C5-C6. 3.  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 There is also foraminal stenosis which is limited in estimation of severity due to artifact 4.  Multilevel degenerative neural foraminal stenosis, greatest of moderate degree at C4-C5 and C6-C7.       Sharlotte Alamo Danie Chandler, MD

## 2019-02-18 NOTE — Case Management (ED)
Case Management Progress Note    NAME:Charles Vaughan                          MRN: [id number]              DOB:22-Feb-1956          AGE: 63 y.o.  ADMISSION DATE: 02/07/2019             DAYS ADMITTED: LOS: 11 days      Todays Date: 02/18/2019    Plan  Patient plans to discharge home when medically stable  w outpt PT script    Interventions  ? Support         NCM will attended huddle.  NCM reviewed EMR, labs, MAR and Notes.  No other CM needs identified at this time  NCM will continue to assess and monitor for changes    ? Info or Referral      ? Discharge Planning    Pt is unable to get accepted into [facility] for various reasons  Pt is able to go home with Outpatient PT script  Pt has RW in room that is his personal RW from [place] that needs to go home with him  Attending aware of therapy needs  Patient has no other need identified at this time  NCM will follow through discharge if any other needs arise.           ?  Medication Needs                       ? Financial      ? Legal      ? Other        Disposition  ? Expected Discharge Date    Expected Discharge Date: 02/19/19  Expected Discharge Time: 1200  ? Transportation   Does the patient need discharge transport arranged?: No  Transportation Name, Phone and Availability #1: One of pts roommates  Transportation Name, Phone and Availability #2: cab pass  Does the patient use Medicaid Transportation?: No  ? Next Level of Care (Acute Psych discharges only)      ? Discharge Disposition                                          Durable Medical Equipment      No service has been selected for the patient.      [place] [facility]      No service has been selected for the patient.      [place]      No service has been selected for the patient.      [place] [facility]      No service has been selected for the patient.

## 2019-02-18 NOTE — Progress Notes
General Progress Note    Name:  Charles Vaughan   Today's Date:  02/17/2019  Admission Date: 02/07/2019  LOS: 10 days                     Assessment/Plan:      28M w/ CAD s/p PCI (05/2018), HTN, HLD, polysubstance abuse (cocaine, etoh), presented to ED w/ 3d of epigastric pain, subjective fevers, dyspnea, poor PO intake, w/ weakness/ dizziness/ falls, in setting of recent COVID-19 pna (4-5wks ago; +SARS-CoV2 on 06/13 but pt asymptomatic. No indication for isolation). On arrival, noted to have hyponatremia (Na 121) w/ AG, respiratory alkalosis, and UDS positive for cocaine. Pt admitted for further mgmt.     Dizziness, improving  Falls  - Orthostatics negative initially, but repeated 06/16 due to worsening/persistent sx  - Slowed/abnormal finger to nose in b/l UES, no nystagmus on exam, normal heel to shin, no focal deficits  - PT also evaluated at bedside  - Patient notes multiple falls at home and does not feels safe going home at this time  - He does note history of spinal surgery when he was a teenager  - 06/15 MRI head/neck: MRI brain unremarkable. 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 There is also foraminal stenosis which is limited in estimation of severity due to artifact  - Echo unremarkable  Plan:  - orhtostatic hypotension mgmt below  - PT/OT still recommended inpatient but now is recommending home with supervision vs home health.  -Ordered rehab consult and will encourage to continue working with PT recs pending    ???  Abdominal pain- resolved  -Onset 3 days ago  -Lower abdomen radiating upwards  -7 out of 10 pain, constant  -Some improvement with morphine  -No change with food  -Lactic acid 1.7  -CT abdomen pelvis with contrast  ???????????????????????????????????????-No radiopaque gallstones, no bile ductal dilatation, unremarkable kidney and ureters, unremarkable pancreas  ???????????????????????????????????????-Reactive periportal lymph nodes ???????????????????????????????????????-Abdominal aorta and iliac arteries normal with moderate plaque  ???????????????????????????????????????-No ascites/free air, no large or small bowel loop abnormalities  ???????????????????????????????????????-Bladder is partially distended, prostate unremarkable  -CTA chest  ???????????????????????????????????????-No evidence of acute segmental pulmonary embolism  ???????????????????????????????????????-Peripheral pulmonary arteries limited by motion  ???????????????????????????????????????-Mild bronchial wall thickening, no pneumonia  -Has no obvious reason for peritonitis, lactic acid 1.7 is against ischemia  -CT abdomen pelvis done with contrast which is not ideal to evaluate abdominal aortic dissection.??????While patient is at high risk due to cocaine use it is less likely that has been ongoing for 3 days and BP has been stable throughout his ED visit  -Could be related to bladder spasms and urinary retention  Plan:  - Continue to monitor  - Foley removed and no evidence of retention thus far. Had been started on flomax for retention, however, stopped on 06/16 due to symptomatic orthostatic hypotension  - zofran, maalox for abd discomfort  ???  Anion gap metabolic acidosis, resolved  Respiratory alkalosis, resolved  -Serum Osm???250,???suggesting that there are no other alcohols causing Gap  -Poor p.o. intake over the last few weeks worse over the last few days  -Suspect that anion gap is result of starvation ketosis  -Suspect that respiratory alkalosis is secondary to hyperventilation from abdominal pain???vs anxiety  -Trop neg x3  -EKG sinus tach, HR 114, twi V2, no ST changes, significant artifact  Plan:  - Monitor BMP  ???  Hyponatremia, resolved  -  Urine Osm 64, urine sodium less than 10  - Serum Osm 250,???specific gravity 1.002  - Consistent with???ADH independent cause  - Less likely psychogenic polydipsia or renal failure  - More likely a result of poor solute intake  - Could also be related to urinary retention  PLAN:  - Monitor BMP  ???  Urinary retention  - Foley placed in ED, 700 cc out - Foley removed 6/15 with no evidence of retention  - Continue Tamsulosin  ???  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   -Tamsulosin discontinued as well    Alcohol use disorder   Cocaine use  - UDS positive for cocaine  - Denies any recent alcohol use    Anxiety/depression  -restart prozac 20 mg daily.  Will look into restarting rest of psych medication based on symptoms  ???  COVID positive  - Reportedly tested positive for COVID-19???4 to 5 weeks ago then negative on repeat.  - Repeat testing 6/13 positive. Discussed with IPAC who notes okay to remove patient from isolation.  ???    DVT ppx: lovenox    Fluids: n/a  Electrolytes: monitor daily  Nutrition: cardiac  PT/OT: following      Dispo: Discussed with social work and apparently he does not have any more skilled days remaining.  He is not at the point where he can discharge home without inpatient level of assistance.however after a short time working with PT in house he will most likely be able to discharge home without need for inpatient assistance.  -May be able to discharge soon with home supervision and physical therapy  -Continue inpatient physical therapy.    Staff name:  Georgios Kina, DO Date:  02/17/2019   Med-private Team E  Team Pager [phone]  ________________________________________________________________________    Subjective    No acute events overnight.  He endorses some dizziness and galt instability which continues.  Denies chest pain, shortness of air, nausea or emesis.  He had anxiety with chest pain today.    Medications Scheduled Meds:ascorbic acid (VITAMIN C) tablet 250 mg, 250 mg, Oral, QDAY  aspirin chewable tablet 81 mg, 81 mg, Oral, QDAY  atorvastatin (LIPITOR) tablet 40 mg, 40 mg, Oral, QDAY  clopiDOGrel (PLAVIX) tablet 75 mg, 75 mg, Oral, QDAY  enoxaparin (LOVENOX) syringe 30 mg, 30 mg, Subcutaneous, BID  FLUoxetine (PROzac) capsule 20 mg, 20 mg, Oral, QDAY  pantoprazole DR (PROTONIX) tablet 40 mg, 40 mg, Oral, QDAY(21)  QUEtiapine (SEROquel) tablet 200 mg, 200 mg, Oral, QHS  vitamins, multi w/minerals tablet 1 tablet, 1 tablet, Oral, QDAY    Continuous Infusions:  PRN and Respiratory Meds:acetaminophen Q4H PRN, aluminum/magnesium hydroxide TID w/ meals PRN, benzonatate TID PRN, clonazePAM BID PRN, clonazePAM QHS PRN, ipratropium/albuterol PRN, melatonin QHS PRN, ondansetron (ZOFRAN) IV Q6H PRN        Objective:                          Vital Signs: Last Filed                 Vital Signs: 24 Hour  Range   BP: 124/66 (06/23 1740)  Temp: 36.6 ???C (97.9 ???F) (06/23 1740)  Pulse: 86 (06/23 1740)  Respirations: 18 PER MINUTE (06/23 1740)  SpO2: 96 % (06/23 1740) BP: (124-141)/(66-85)   Temp:  [36.5 ???C (97.7 ???F)-36.8 ???C (98.2 ???F)]   Pulse:  [71-96]   Respirations:  [16 PER MINUTE-18 PER MINUTE]   SpO2:  [96 %-99 %]    Intensity Pain Scale (Self Report): 5 (02/17/19 0820)  Intensity Pain Scale (Self Report): 5 (02/17/19 0820) Vitals:    02/07/19 0524 02/09/19 1400 02/13/19 1019   Weight: 75 kg (165 lb 4.8 oz) 76.4 kg (168 lb 6.4 oz) 76.2 kg (168 lb)       Intake/Output Summary:  (Last 24 hours)    Intake/Output Summary (Last 24 hours) at 02/17/2019 1921  Last data filed at 02/17/2019 1740  Gross per 24 hour   Intake 666 ml   Output 1025 ml   Net -359 ml      Stool Occurrence: 0      Physical exam:  General: NAD  HEENT: Normocephalic and atraumatic.  Moist Mucous membranes.  Pupils are equal, round and reactive to light. No discharge. No scleral icterus. Missing several teeth  Cardiovascular: bradycardic, regular rhythm, no murmurs. Pulmonary/Chest: Effort normal and breath sounds normal. No respiratory distress. No wheezes or crackles  Abdominal: Soft, nondistended, nontender, and no mass. Bowel sounds are normal.  Ext:  No pitting edema in extremities. Nml distal pulses  Neurological: A&Ox3, no focal findings  Skin: Warm and dry. No rash noted. No erythema. No pallor.    Lab/Imaging Reviewed  24-hour labs:    No results found for this visit on 02/07/19 (from the past 24 hour(s)).

## 2019-02-19 ENCOUNTER — Encounter: Admit: 2019-02-19 | Discharge: 2019-02-19

## 2019-02-19 ENCOUNTER — Encounter: Admit: 2019-02-09 | Discharge: 2019-02-09

## 2019-02-19 ENCOUNTER — Emergency Department: Admit: 2019-02-07 | Discharge: 2019-02-08

## 2019-02-19 ENCOUNTER — Encounter: Admit: 2019-02-13 | Discharge: 2019-02-13

## 2019-02-19 DIAGNOSIS — Z87891 Personal history of nicotine dependence: Secondary | ICD-10-CM

## 2019-02-19 DIAGNOSIS — R269 Unspecified abnormalities of gait and mobility: Secondary | ICD-10-CM

## 2019-02-19 DIAGNOSIS — F419 Anxiety disorder, unspecified: Secondary | ICD-10-CM

## 2019-02-19 DIAGNOSIS — Z79899 Other long term (current) drug therapy: Secondary | ICD-10-CM

## 2019-02-19 DIAGNOSIS — I951 Orthostatic hypotension: Secondary | ICD-10-CM

## 2019-02-19 DIAGNOSIS — R339 Retention of urine, unspecified: Secondary | ICD-10-CM

## 2019-02-19 DIAGNOSIS — F329 Major depressive disorder, single episode, unspecified: Secondary | ICD-10-CM

## 2019-02-19 DIAGNOSIS — I10 Essential (primary) hypertension: Secondary | ICD-10-CM

## 2019-02-19 DIAGNOSIS — R296 Repeated falls: Secondary | ICD-10-CM

## 2019-02-19 DIAGNOSIS — I2510 Atherosclerotic heart disease of native coronary artery without angina pectoris: Secondary | ICD-10-CM

## 2019-02-19 DIAGNOSIS — E871 Hypo-osmolality and hyponatremia: Secondary | ICD-10-CM

## 2019-02-19 DIAGNOSIS — E785 Hyperlipidemia, unspecified: Secondary | ICD-10-CM

## 2019-02-19 DIAGNOSIS — E860 Dehydration: Secondary | ICD-10-CM

## 2019-02-19 DIAGNOSIS — F1490 Cocaine use, unspecified, uncomplicated: Secondary | ICD-10-CM

## 2019-02-19 DIAGNOSIS — Z955 Presence of coronary angioplasty implant and graft: Secondary | ICD-10-CM

## 2019-02-19 DIAGNOSIS — M1990 Unspecified osteoarthritis, unspecified site: Secondary | ICD-10-CM

## 2019-02-19 DIAGNOSIS — Z7982 Long term (current) use of aspirin: Secondary | ICD-10-CM

## 2019-02-19 DIAGNOSIS — U071 COVID-19: Secondary | ICD-10-CM

## 2019-02-19 DIAGNOSIS — E874 Mixed disorder of acid-base balance: Secondary | ICD-10-CM

## 2019-02-19 DIAGNOSIS — N3289 Other specified disorders of bladder: Secondary | ICD-10-CM

## 2019-02-19 DIAGNOSIS — R000 Tachycardia, unspecified: Secondary | ICD-10-CM

## 2019-02-19 DIAGNOSIS — F1010 Alcohol abuse, uncomplicated: Secondary | ICD-10-CM

## 2019-02-19 DIAGNOSIS — R001 Bradycardia, unspecified: Secondary | ICD-10-CM

## 2019-02-19 DIAGNOSIS — M4802 Spinal stenosis, cervical region: Secondary | ICD-10-CM

## 2019-02-19 DIAGNOSIS — R109 Unspecified abdominal pain: Secondary | ICD-10-CM

## 2019-02-19 MED ORDER — FLUOXETINE 20 MG PO CAP
20 mg | ORAL_CAPSULE | Freq: Every day | ORAL | 0 refills | Status: DC
Start: 2019-02-19 — End: 2019-06-11
  Filled 2019-02-19: qty 30, 30d supply, fill #1

## 2019-02-19 MED ORDER — DICYCLOMINE 10 MG PO CAP
10 mg | ORAL_CAPSULE | Freq: Before meals | ORAL | 0 refills | Status: DC | PRN
Start: 2019-02-19 — End: 2019-05-11
  Filled 2019-02-19: qty 120, 30d supply, fill #1

## 2019-02-19 MED ORDER — ALBUTEROL SULFATE 90 MCG/ACTUATION IN HFAA
2 | RESPIRATORY_TRACT | 0 refills | Status: AC | PRN
Start: 2019-02-19 — End: ?
  Filled 2019-02-19: qty 25, 25d supply, fill #1

## 2019-02-19 MED ORDER — FLUTICASONE FUROATE-VILANTEROL 100-25 MCG/DOSE IN DSDV
1 | Freq: Every day | RESPIRATORY_TRACT | 0 refills | Status: DC
Start: 2019-02-19 — End: 2019-05-15
  Filled 2019-02-19: qty 60, 30d supply, fill #1

## 2019-02-19 MED ORDER — IPRATROPIUM BROMIDE 17 MCG/ACTUATION IN HFAA
2 | RESPIRATORY_TRACT | 0 refills | 30.00000 days | Status: DC
Start: 2019-02-19 — End: 2019-05-11
  Filled 2019-02-19: qty 12, 25d supply, fill #1

## 2019-02-19 MED ORDER — CLONAZEPAM 0.5 MG PO TAB
.5 mg | ORAL_TABLET | Freq: Two times a day (BID) | ORAL | 0 refills | Status: DC | PRN
Start: 2019-02-19 — End: 2019-05-11
  Filled 2019-02-19: qty 20, 10d supply, fill #1

## 2019-02-19 MED ORDER — THIAMINE HCL (VITAMIN B1) 100 MG PO TAB
100 mg | ORAL_TABLET | Freq: Every day | ORAL | 0 refills | 10.00000 days | Status: CN
Start: 2019-02-19 — End: ?

## 2019-02-19 MED ORDER — SODIUM CHLORIDE 0.65 % NA SPRA
1-2 | NASAL | 0 refills | 30.00000 days | Status: CN | PRN
Start: 2019-02-19 — End: ?

## 2019-02-19 MED ORDER — MULTIVITAMIN PO TAB
1 | ORAL_TABLET | Freq: Every day | ORAL | 0 refills | 90.00000 days | Status: CN
Start: 2019-02-19 — End: ?

## 2019-02-19 MED ORDER — NITROGLYCERIN 0.4 MG SL SUBL
.4 mg | ORAL_TABLET | SUBLINGUAL | 0 refills | 9.00000 days | Status: AC | PRN
Start: 2019-02-19 — End: ?
  Filled 2019-02-19: qty 25, 8d supply, fill #1

## 2019-02-19 MED ORDER — CETIRIZINE 10 MG PO TAB
10 mg | ORAL_TABLET | Freq: Every morning | ORAL | 0 refills | Status: DC
Start: 2019-02-19 — End: 2019-05-11

## 2019-02-19 MED ORDER — OLANZAPINE 5 MG PO TAB
2.5 mg | ORAL_TABLET | Freq: Every evening | ORAL | 0 refills | 30.00000 days | Status: DC
Start: 2019-02-19 — End: 2019-05-11
  Filled 2019-02-19: qty 15, 30d supply, fill #1

## 2019-02-19 MED ORDER — OMEPRAZOLE 20 MG PO CPDR
20 mg | ORAL_CAPSULE | Freq: Every day | ORAL | 0 refills | Status: DC
Start: 2019-02-19 — End: 2019-05-15
  Filled 2019-02-19: qty 30, 30d supply, fill #1

## 2019-02-19 MED ORDER — CALCIUM CARBONATE 200 MG CALCIUM (500 MG) PO CHEW
500 mg | ORAL_TABLET | ORAL | 0 refills | 33.00000 days | Status: DC | PRN
Start: 2019-02-19 — End: 2019-06-11

## 2019-02-19 MED ORDER — BUSPIRONE 5 MG PO TAB
5 mg | ORAL_TABLET | Freq: Three times a day (TID) | ORAL | 0 refills | Status: DC
Start: 2019-02-19 — End: 2019-05-11
  Filled 2019-02-19: qty 90, 30d supply, fill #1

## 2019-02-19 MED FILL — HYDROXYZINE HCL 25 MG PO TAB: 25 mg | ORAL | 12 days supply | Qty: 90 | Fill #1 | Status: CP

## 2019-02-19 MED FILL — CLOPIDOGREL 75 MG PO TAB: 75 mg | ORAL | 30 days supply | Qty: 30 | Status: CN

## 2019-02-19 MED FILL — BENZTROPINE 1 MG PO TAB: 1 mg | ORAL | 30 days supply | Qty: 30 | Fill #1 | Status: CP

## 2019-02-19 MED FILL — QUETIAPINE 200 MG PO TAB: 200 mg | ORAL | 30 days supply | Qty: 30 | Fill #1 | Status: CP

## 2019-02-19 MED FILL — ATORVASTATIN 40 MG PO TAB: 40 mg | ORAL | 30 days supply | Qty: 30 | Status: CN

## 2019-02-19 NOTE — Progress Notes
[facility] discharged on 02/19/2019.   Marland Kitchen  Discharge instructions reviewed with patient.  Valuables returned: All valuables returned   .  Home medications:    .  Functional assessment at discharge complete: Yes .      Patient given discharge instructions and all questions addressed. Peripheral IV taken out. All valuables returned. Patient taken down to front entrance via wheelchair, where patient was driven home by friends

## 2019-02-19 NOTE — Progress Notes
PHYSICAL THERAPY  PROGRESS NOTE        Name: Charles Vaughan        MRN: [id number]          DOB: [birth year]          Age: 63 y.o.  Admission Date: 02/07/2019             LOS: 12 days        Mobility  Patient Turn/Position: Weight shifted (Bed)  Progressive Mobility Level: Walk laps  Distance Walked (feet): 250 ft  Level of Assistance: Assist X1  Assistive Device: Walker  Time Tolerated: 11-30 minutes  Activity Limited By: Fatigue;Weakness;Other (Comment)(anxiety)    Subjective  Significant hospital events: 64 yo M with PMH of HTN and drug use presents to ER with anxiety and abdominal pain. Patient is a poor historian. Reports epigastric abdominal pain for last 3 days. He took Oxycontin today without relief. Per EMS, patient is a known crack cocaine user. Patient admits to last using 2 days ago. Reports difficulty urinating as well as nausea. Denies vomiting, diarrhea, constipation, melena, hematochezia. Patient states headache with blurred vision as well as chest pain and shortness of breath. COVID + 6/13 although pt reports testing positive 4-5 weeks ago as well.   Mental / Cognitive Status: Alert;Oriented;Cooperative;Follows Commands  Pain: Patient has no complaint of pain  Pain Interventions: Patient agrees to participate in therapy  Ambulation Assist: Independent Mobility in [employer] without Device  Patient Owned Equipment: None  Home Situation: Lives wth Roommate  Type of Home: House  Entry Stairs: No Stairs  In-Home Stairs: No Stairs  Comments: Patient reports history of falls over the last 3 months although increased dizziness/falls over the last week especially. Pt reports he does not use walker. Of note, pt somewhat tangential and poor historian, initially disoriented to place although after further conversation appears more oriented.     Bed Mobility/Transfer  Bed Mobility: Supine to Sit: Standby Assist;Use of Rail  Bed Mobility: Sit to Supine: Standby Assist;Bed Flat  Transfer Type: Sit to/from Stand Transfer: Assistance Level: Bed;To/From;Standby Assist  Transfer: Assistive Device: Nurse, adult  Transfers: Type Of Assistance: Materials engineer;For Safety Considerations  End Of Activity Status: Instructed Patient to Request Assist with Mobility;Instructed Patient to Use Call Light;Nursing Notified;In Bed    Balance  Sitting Balance: Static Sitting Balance;Standby Assist;2 UE Support  Standing Balance: Static Standing Balance;2 UE support;Standby Assist    Gait  Gait Distance: 250 feet  Gait: Assistance Level: Safety Considerations;Standby Assist(contact guard assist)  Gait: Assistive Device: Roller Walker  Gait: Descriptors: Decreased foot clearance RLE;Decreased foot clearance LLE;Loss of balance;Decreased step length;Variable step length;Pathway deviations;Pace: Slow  Comments: several standing rest breaks due to anxiety. no loss of balance noted   Activity Limited By: Nausea;Complaint of Fatigue;Patient Choice    Education  Persons Educated: Patient  Patient Barriers To Learning: None Noted  Teaching Methods: Verbal Instruction;Demonstration  Patient Response: Verbalized Understanding;Return Demonstration  Topics: Plan/Goals of PT Interventions;Use of Assistive Device/Orthosis;Mobility Progression;Safety Awareness;Up with Assist Only;Recommend Continued Therapy    Assessment/Progress  Impaired Mobility Due To: Decreased Activity Tolerance;Impaired Balance  Assessment/Progress: Should Improve w/ Continued PT    AM-PAC 6 Clicks Basic Mobility Inpatient  Turning from your back to your side while in a flat bed without using bed rails: None  Moving from lying on your back to sitting on the side of a flatbed without using bedrails : None  Moving to and from a bed to a chair (including  a wheelchair): A Little  Standing up from a chair using your arms (e.g. wheelchair, or bedside chair): A Little  To walk in hospital room: A Lot  Climbing 3-5 steps with a railing: A Lot  Raw Score: 18  Standardized (T-scale) Score: 41.05 Basic Mobility CMS 0-100%: 40.47  CMS G Code Modifier for Basic Mobility: CK    Goals  Goal Formulation: With Patient  Time For Goal Achievement: 5 days  Patient Will Go Supine To/From Sit: Independently, Met  Patient Will Transfer Bed/Chair: Independently, Ongoing  Patient Will Transfer Sit to Stand: Independently, Ongoing  Patient Will Ambulate: Greater than 200 Feet, w/ Stand By Assist, Ongoing    Plan  Treatment Interventions: Mobility Training;Balance Activities;Endurance Training  Plan Frequency: 5 Days per Week  PT Plan for Next Visit: standing balance, progress ambulation when able,     PT Discharge Recommendations  Recommendation: Home with consistent supervision/assistance  Recommendation for Therapy Post Discharge: Home health  Patient Currently Requires Physical Assist With: Transfers;Ambulation  Patient Currently Requires Supervision For: Mobility  Patient Currently Requires Equipment: Walker with wheels    Therapist: Romana Juniper, PT  Date: 02/19/2019

## 2019-02-19 NOTE — Progress Notes
General Progress Note    Name:  Charles Vaughan   Today's Date:  02/19/2019  Admission Date: 02/07/2019  LOS: 12 days                     Assessment/Plan:      44M w/ CAD s/p PCI (05/2018), HTN, HLD, polysubstance abuse (cocaine, etoh), presented to ED w/ 3d of epigastric pain, subjective fevers, dyspnea, poor PO intake, w/ weakness/ dizziness/ falls, in setting of recent COVID-19 pna (4-5wks ago; +SARS-CoV2 on 06/13 but pt asymptomatic. No indication for isolation). On arrival, noted to have hyponatremia (Na 121) w/ AG, respiratory alkalosis, and UDS positive for cocaine. Pt admitted for further mgmt.     Dizziness, improving  Falls  - Orthostatics negative initially, but repeated 06/16 due to worsening/persistent sx  - Slowed/abnormal finger to nose in b/l UES, no nystagmus on exam, normal heel to shin, no focal deficits  - PT also evaluated at bedside  - Patient notes multiple falls at home and does not feels safe going home at this time  - He does note history of spinal surgery when he was a teenager  - 06/15 MRI head/neck: MRI brain unremarkable. 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 There is also foraminal stenosis which is limited in estimation of severity due to artifact  - Echo unremarkable  Plan:  - orhtostatic hypotension mgmt below  - PT/OT still recommended inpatient but now is recommending home with supervision vs home health.  -After further review [facility] determined that he would not have a stable discharge plan for home after rehab and felt that he would not be an idea canidate    ???  Abdominal pain- resolved  -Onset 3 days ago  -Lower abdomen radiating upwards  -7 out of 10 pain, constant  -Some improvement with morphine  -No change with food  -Lactic acid 1.7  -CT abdomen pelvis with contrast  ???????????????????????????????????????-No radiopaque gallstones, no bile ductal dilatation, unremarkable kidney and ureters, unremarkable pancreas  ???????????????????????????????????????-Reactive periportal lymph nodes  ???????????????????????????????????????-Abdominal aorta and iliac arteries normal with moderate plaque  ???????????????????????????????????????-No ascites/free air, no large or small bowel loop abnormalities  ???????????????????????????????????????-Bladder is partially distended, prostate unremarkable  -CTA chest  ???????????????????????????????????????-No evidence of acute segmental pulmonary embolism  ???????????????????????????????????????-Peripheral pulmonary arteries limited by motion  ???????????????????????????????????????-Mild bronchial wall thickening, no pneumonia  -Has no obvious reason for peritonitis, lactic acid 1.7 is against ischemia  -CT abdomen pelvis done with contrast which is not ideal to evaluate abdominal aortic dissection.??????While patient is at high risk due to cocaine use it is less likely that has been ongoing for 3 days and BP has been stable throughout his ED visit  -Could be related to bladder spasms and urinary retention  Plan:  - Continue to monitor  - Foley removed and no evidence of retention thus far. Had been started on flomax for retention, however, stopped on 06/16 due to symptomatic orthostatic hypotension  - zofran, maalox for abd discomfort  ???  Anion gap metabolic acidosis, resolved  Respiratory alkalosis, resolved  -Serum Osm???250,???suggesting that there are no other alcohols causing Gap  -Poor p.o. intake over the last few weeks worse over the last few days  -Suspect that anion gap is result of starvation ketosis  -Suspect that respiratory alkalosis is secondary to hyperventilation from abdominal pain???vs anxiety  -Trop neg x3  -EKG sinus tach, HR 114, twi  V2, no ST changes, significant artifact  Plan:  - Monitor BMP  ???  Hyponatremia, resolved  - Urine Osm 64, urine sodium less than 10  - Serum Osm 250,???specific gravity 1.002  - Consistent with???ADH independent cause  - Less likely psychogenic polydipsia or renal failure  - More likely a result of poor solute intake  - Could also be related to urinary retention  PLAN: - Monitor BMP  ???  Urinary retention  - Foley placed in ED, 700 cc out  - Foley removed 6/15 with no evidence of retention  - Continue Tamsulosin  ???  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   -Tamsulosin discontinued as well    Alcohol use disorder   Cocaine use  - UDS positive for cocaine  - Denies any recent alcohol use    Anxiety/depression  -restart prozac 20 mg daily.  Will look into restarting rest of psych medication based on symptoms  ???  COVID positive  - Reportedly tested positive for COVID-19???4 to 5 weeks ago then negative on repeat.  - Repeat testing 6/13 positive. Discussed with IPAC who notes okay to remove patient from isolation.  ???    DVT ppx: lovenox    Fluids: n/a  Electrolytes: monitor daily  Nutrition: cardiac  PT/OT: following      Dispo: Discharge with home health and physical therapy      Staff name:  Janie Capp, DO Date:  02/19/2019   Med-private Team E  Team Pager [phone]  ________________________________________________________________________    Subjective    No acute events overnight. His mobility has been slowly improving.  Appears to wax and wane.  Denies fevers, chills, chest pain or shortness of air.    Medications  Scheduled Meds:ascorbic acid (VITAMIN C) tablet 250 mg, 250 mg, Oral, QDAY  aspirin chewable tablet 81 mg, 81 mg, Oral, QDAY  atorvastatin (LIPITOR) tablet 40 mg, 40 mg, Oral, QDAY  clopiDOGrel (PLAVIX) tablet 75 mg, 75 mg, Oral, QDAY  enoxaparin (LOVENOX) syringe 30 mg, 30 mg, Subcutaneous, BID  FLUoxetine (PROzac) capsule 20 mg, 20 mg, Oral, QDAY pantoprazole DR (PROTONIX) tablet 40 mg, 40 mg, Oral, QDAY(21)  QUEtiapine (SEROquel) tablet 200 mg, 200 mg, Oral, QHS  vitamins, multi w/minerals tablet 1 tablet, 1 tablet, Oral, QDAY    Continuous Infusions:  PRN and Respiratory Meds:acetaminophen Q4H PRN, aluminum/magnesium hydroxide TID w/ meals PRN, benzonatate TID PRN, clonazePAM BID PRN, clonazePAM QHS PRN, ipratropium/albuterol PRN, melatonin QHS PRN, ondansetron (ZOFRAN) IV Q6H PRN, prochlorperazine Q6H PRN        Objective:                          Vital Signs: Last Filed                 Vital Signs: 24 Hour Range   BP: 128/79 (06/25 0913)  Temp: 36.4 ???C (97.6 ???F) (06/25 0913)  Pulse: 78 (06/25 0913)  Respirations: 16 PER MINUTE (06/25 0913)  SpO2: 100 % (06/25 0913) BP: (117-151)/(52-83)   Temp:  [36.2 ???C (97.1 ???F)-36.7 ???C (98 ???F)]  Pulse:  [78-99]   Respirations:  [16 PER MINUTE-20 PER MINUTE]   SpO2:  [93 %-100 %]    Intensity Pain Scale (Self Report): 5 (02/19/19 0820)  Intensity Pain Scale (Self Report): 5 (02/18/19 2008) Vitals:    02/07/19 0524 02/09/19 1400 02/13/19 1019   Weight: 75 kg (165 lb 4.8 oz) 76.4 kg (168 lb 6.4 oz) 76.2 kg (168 lb)       Intake/Output Summary:  (Last 24 hours)    Intake/Output Summary (Last 24 hours) at 02/19/2019 1259  Last data filed at 02/19/2019 1200  Gross per 24 hour   Intake 600 ml   Output 2525 ml   Net -1925 ml      Stool Occurrence: 0      Physical exam:  General: NAD  HEENT: Normocephalic and atraumatic.  Moist Mucous membranes.  Pupils are equal, round and reactive to light. No discharge. No scleral icterus. Missing several teeth  Cardiovascular: bradycardic, regular rhythm, no murmurs.   Pulmonary/Chest: Effort normal and breath sounds normal. No respiratory distress. No wheezes or crackles  Abdominal: Soft, nondistended, nontender, and no mass. Bowel sounds are normal.  Ext:  No pitting edema in extremities. Nml distal pulses  Neurological: A&Ox3, no focal findings Skin: Warm and dry. No rash noted. No erythema. No pallor.    Lab/Imaging Reviewed  24-hour labs:    No results found for this visit on 02/07/19 (from the past 24 hour(s)).

## 2019-02-20 NOTE — Discharge Instructions - Pharmacy
Physician Discharge Summary      Name: Charles Vaughan  Medical Record Number: [id number]        Account Number:  [id number]  Date Of Birth:  1956/07/27                         Age:  63 years   Admit date:  02/07/2019                     Discharge date:  02/19/2019    Attending Physician:  Marti Sleigh Cece Milhouse, DO             Service: Med Private E- [phone]    Physician Summary completed by: Myrtie Soman, DO     Reason for hospitalization: Abdominal pain, respiratory alkalosis and hyponatremia    Significant PMH:   Medical History:   Diagnosis Date   ??? CAD (coronary artery disease)    ??? Hyperlipidemia         Allergies: Patient has no known allergies.    Admission Physical Exam notable for:    BP: 113/83 (06/13 0400)  Temp: 36.7 ???C (98.1 ???F) (06/13 0524)  Pulse: 109 (06/13 0524)  Respirations: 16 PER MINUTE (06/13 0430)  SpO2: 99 % (06/13 0524)  SpO2 Pulse: 111 (06/13 0430)  Height: 172.7 cm (68) (06/13 0524) BP: (110-152)/(80-140)   Temp:  [36.7 ???C (98.1 ???F)-37.1 ???C (98.7 ???F)]   Pulse:  [109-119]   Respirations:  [16 PER MINUTE-28 PER MINUTE]   SpO2:  [93 %-99 %]     ???   ???  General: Alert, cooperative, anxious appearing, agitated  Head:  Normocephalic, without obvious abnormality, atraumatic  Lungs:  Clear to auscultation bilaterally  Heart:    Tachycardic, regular rhythm, S1, S2 normal, no murmur, click rub or gallop  Abdomen: Generalized tenderness, bowel sounds x4, no rebound, no guarding  Extremities:  Extremities normal, atraumatic, no cyanosis or edema  Skin: No obvious signs of rashes  Neurologic: Cranial nerves II through XII grossly intact  Psych: Anxious appearing  ???    Admission Lab/Radiology studies notable for:   Result Value Ref Range   ??? White Blood Cells 7.9 4.5 - 11.0 K/UL   ??? RBC 4.69 4.4 - 5.5 M/UL   ??? Hemoglobin 13.3 (L) 13.5 - 16.5 GM/DL   ??? Hematocrit 37.6 (L) 40 - 50 %   ??? MCV 80.2 80 - 100 FL   ??? MCH 28.3 26 - 34 PG   ??? MCHC 35.3 32.0 - 36.0 G/DL   ??? RDW 16.0 (H) 11 - 15 % ??? Platelet Count 365 150 - 400 K/UL   ??? MPV 8.4 7 - 11 FL   ??? Neutrophils 71 41 - 77 %   ??? Lymphocytes 19 (L) 24 - 44 %   ??? Monocytes 7 4 - 12 %   ??? Eosinophils 1 0 - 5 %   ??? Basophils 2 0 - 2 %   ??? Absolute Neutrophil Count 5.66 1.8 - 7.0 K/UL   ??? Absolute Lymph Count 1.48 1.0 - 4.8 K/UL   ??? Absolute Monocyte Count 0.54 0 - 0.80 K/UL   ??? Absolute Eosinophil Count 0.10 0 - 0.45 K/UL   ??? Absolute Basophil Count 0.12 0 - 0.20 K/UL   COMPREHENSIVE METABOLIC PANEL   ??? Collection Time: 02/07/19 12:20 AM   Result Value Ref Range   ??? Sodium 121 (L) 137 - 147 MMOL/L   ??? Potassium  4.0 3.5 - 5.1 MMOL/L   ??? Chloride 88 (L) 98 - 110 MMOL/L   ??? Glucose 93 70 - 100 MG/DL   ??? Blood Urea Nitrogen 7 7 - 25 MG/DL   ??? Creatinine 1.11 0.4 - 1.24 MG/DL   ??? Calcium 9.4 8.5 - 10.6 MG/DL   ??? Total Protein 6.8 6.0 - 8.0 G/DL   ??? Total Bilirubin 0.7 0.3 - 1.2 MG/DL   ??? Albumin 4.1 3.5 - 5.0 G/DL   ??? Alk Phosphatase 104 25 - 110 U/L   ??? AST (SGOT) 18 7 - 40 U/L   ??? CO2 15 (L) 21 - 30 MMOL/L   ??? ALT (SGPT) 12 7 - 56 U/L   ??? Anion Gap 18 (H) 3 - 12   ??? eGFR Non African American >60 >60 mL/min   ??? eGFR African American >60        Positive cocaine in UDS    Brief Hospital Course:    36M w/ CAD s/p PCI (05/2018), HTN, HLD, polysubstance abuse (cocaine, etoh), presented to ED w/ 3d of epigastric pain, subjective fevers, dyspnea, poor PO intake, w/ weakness/ dizziness/ falls, in setting of recent COVID-19 pna (4-5wks ago; +SARS-CoV2 on 06/13 but pt asymptomatic. No indication for isolation). On arrival, noted to have hyponatremia (Na 121) w/ AG, respiratory alkalosis, and UDS positive for cocaine. Pt admitted for further mgmt.   ???  Dizziness, improving  Falls  - Orthostatics negative initially, but repeated 06/16 due to worsening/persistent sx  - Slowed/abnormal finger to nose in b/l UES, no nystagmus on exam, normal heel to shin, no focal deficits  - PT also evaluated at bedside - Patient notes multiple falls at home and does not feels safe going home at this time  - He does note history of spinal surgery when he was a teenager  - 06/15 MRI head/neck: MRI brain unremarkable. 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 There is also foraminal stenosis which is limited in estimation of severity due to artifact  - Echo unremarkable  Plan:  - orhtostatic hypotension mgmt below  - PT/OT still recommended inpatient but now is recommending home with supervision vs home health.  -After further review [facility] determined that he would not have a stable discharge plan for home after rehab and felt that he would not be an idea canidate  ???  ???  Abdominal pain- resolved  -Onset 3 days prior to admission  -Lower abdomen radiating upwards  -7 out of 10 pain, constant  -Some improvement with morphine  -No change with food  -Lactic acid 1.7  -CT abdomen pelvis with contrast  ???????????????????????????????????????-No radiopaque gallstones, no bile ductal dilatation, unremarkable kidney and ureters, unremarkable pancreas  ???????????????????????????????????????-Reactive periportal lymph nodes  ???????????????????????????????????????-Abdominal aorta and iliac arteries normal with moderate plaque  ???????????????????????????????????????-No ascites/free air, no large or small bowel loop abnormalities  ???????????????????????????????????????-Bladder is partially distended, prostate unremarkable  -CTA chest  ???????????????????????????????????????-No evidence of acute segmental pulmonary embolism  ???????????????????????????????????????-Peripheral pulmonary arteries limited by motion  ???????????????????????????????????????-Mild bronchial wall thickening, no pneumonia  -Has no obvious reason for peritonitis, lactic acid 1.7 is against ischemia  -CT abdomen pelvis done with contrast which is not ideal to evaluate abdominal aortic dissection.??????While patient is at high risk due to cocaine use it is less likely that has been ongoing for 3 days and BP has been stable throughout his ED visit -Could be related to  bladder spasms and urinary retention  Plan:  - Continue to monitor  - Foley removed and no evidence of retention thus far. Had been started on flomax for retention, however, stopped on 06/16 due to symptomatic orthostatic hypotension  - zofran, maalox for abd discomfort  ???  Anion gap metabolic acidosis, resolved  Respiratory alkalosis, resolved  -Serum Osm???250,???suggesting that there are no other alcohols causing Gap  -Poor p.o. intake over the last few weeks worse over the last few days  -Suspect that anion gap is result of starvation ketosis  -Suspect that respiratory alkalosis is secondary to hyperventilation from abdominal pain???vs anxiety  -Trop neg x3  -EKG sinus tach, HR 114, twi V2, no ST changes, significant artifact  ???  Hyponatremia, resolved  - Urine Osm 64, urine sodium less than 10  - Serum Osm 250,???specific gravity 1.002  - Consistent with???ADH independent cause  - Less likely psychogenic polydipsia or renal failure  - More likely a result of poor solute intake  - Could also be related to urinary retention    Urinary retention  - Foley placed in ED, 700 cc out  - Foley removed 6/15 with no evidence of retention  - Continue Tamsulosin  ???  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   -Tamsulosin discontinued as well  ???  Alcohol use disorder???  Cocaine use  - UDS positive for cocaine  - Denies any recent alcohol???use  ???  Anxiety/depression  -restart prozac 20 mg daily.  Will look into restarting rest of psych medication based on symptoms  ???  COVID positive - Reportedly tested positive for???COVID-19???4 to 5 weeks ago then negative???on repeat.  - Repeat testing 6/13 positive. Discussed with IPAC who notes okay to remove patient from isolation.    Disposition: Evaluated by physical therapy could not get approval for acute rehab plan for home health with physical therapy      Condition at Discharge: Stable    Discharge Diagnoses:      Hospital Problems        Active Problems    * (Principal) Abdominal pain          Surgical Procedures: None    Significant Diagnostic Studies and Procedures: noted in brief hospital course    Consults:  Rehab medicine    Patient Disposition: Home with Home Health Care       Patient instructions/medications:       Cardiac Diet    Limiting unhealthy fats and cholesterol is the most important step you can take in reducing your risk for cardiovascular disease.  Unhealthy fats include saturated and trans fats.  Monitor your sodium and cholesterol intake.  Restrict your sodium to 2g (grams) or 2000mg  (milligrams) daily, and your cholesterol to 200mg  daily.    If you have questions regarding your diet at home, you may contact a dietitian at [phone].       Regular Diet    You have no dietary restriction. Please continue with a healthy balanced diet.     Report These Signs and Symptoms    Please contact your doctor if you have any of the following  symptoms: temperature higher than 100.4 degrees F, uncontrolled pain, persistent nausea and/or vomiting, difficulty breathing, chest pain, severe abdominal pain, headache, unable to urinate, unable to have bowel movement or drainage with a foul odor     Questions About Your Stay    If you have an emergency after discharge, please dial 9-1-1.    You may contact your discharging physician up to 7 days after discharge for questions about your hospitalization, discharge instructions, or medications by calling [phone] during regular business hours (8AM-4PM) and asking to speak to the doctor listed on the discharge information.       If you are not calling during business hours, ask for the on-call doctor.    If you have new  or worsening symptoms, you may be directed to your primary care provider (PCP) for ongoing questions, an Emergency Department, or an Urgent Care Clinic for a more immediate evaluation.    For all calls or questions more than 7 days after discharge, please contact your primary care provider (PCP).    For medications after discharge: pain (opioid) medicine cannot be refilled or prescribed by calling your discharging physician.  These medications need to be filled by your primary care provider (PCP).  Regular refill requests should be directed to your primary care provider (PCP).     Discharging attending physician: Myrtie Soman [[id number]]      Activity as Tolerated    It is important to keep increasing your activity level after you leave the hospital.  Moving around can help prevent blood clots, lung infection (pneumonia) and other problems.  Gradually increasing the number of times you are up moving around will help you return to your normal activity level more quickly.  Continue to increase the number of times you are up to the chair and walking daily to return to your normal activity level. Begin to work toward your normal activity level at discharge     Report These Signs and Symptoms    Please contact your doctor if you have any of the following symptoms: temperature higher than 100.4 degrees F, uncontrolled pain, persistent nausea and/or vomiting, difficulty breathing, chest pain or severe abdominal pain     Questions About Your Stay    For questions or concerns regarding your hospital stay, call [phone].     Discharging attending physician: Myrtie Soman [[id number]]      Activity as Tolerated    It is important to keep increasing your activity level after you leave the hospital.  Moving around can help prevent blood clots, lung infection (pneumonia) and other problems.  Gradually increasing the number of times you are up moving around will help you return to your normal activity level more quickly.  Continue to increase the number of times you are up to the chair and walking daily to return to your normal activity level. Begin to work toward your normal activity level at discharge      Current Discharge Medication List       START taking these medications    Details   benzonatate (TESSALON PERLES) 100 mg capsule Take one capsule by mouth three times daily as needed for Cough.  Qty: 20 capsule, Refills: 0    PRESCRIPTION TYPE:  Normal      !! clonazePAM (KLONOPIN) 0.5 mg tablet Take one tablet by mouth twice daily as needed.  Qty: 20 tablet, Refills: 0    PRESCRIPTION TYPE:  Normal       !! - Potential duplicate medications  found. Please discuss with provider.       CONTINUE these medications which have been CHANGED or REFILLED    Details   albuterol sulfate (PROAIR HFA) 90 mcg/actuation aerosol inhaler Inhale two puffs by mouth into the lungs every 6 hours as needed for Wheezing or Shortness of Breath. Shake well before use.  Qty: 25.5 g, Refills: 0    PRESCRIPTION TYPE:  Normal  Comments: Pharmacist may choose either generic albuterol HFA, ProAir HFA, Ventolin HFA, or Proventil HFA based on what is cheapest for the patient.      busPIRone (BUSPAR) 5 mg tablet Take one tablet by mouth three times daily.  Qty: 90 tablet, Refills: 0    PRESCRIPTION TYPE:  Normal      calcium carbonate (TUMS) 500 mg (200 mg elemental calcium) chewable tablet Chew one tablet by mouth every 6 hours as needed.  Qty: 100 tablet, Refills: 0    PRESCRIPTION TYPE:  Normal      cetirizine (ZYRTEC) 10 mg tablet Take one tablet by mouth every morning.  Qty: 30 tablet, Refills: 0    PRESCRIPTION TYPE:  Normal      dicyclomine (BENTYL) 10 mg capsule Take one capsule by mouth before meals and at bedtime as needed (dyspepsia, abdominal pain).  Qty: 120 capsule, Refills: 0    PRESCRIPTION TYPE:  Normal      FLUoxetine (PROZAC) 20 mg capsule Take one capsule by mouth daily.  Qty: 30 capsule, Refills: 0    PRESCRIPTION TYPE:  Normal      fluticasone-vilanterol(+) (BREO ELLIPTA) 100-25 mcg inhalation disk Inhale one puff by mouth into the lungs daily.  Qty: 60 each, Refills: 0    PRESCRIPTION TYPE:  Normal      ipratropium bromide (ATROVENT HFA) 17 mcg/actuation inhaler Inhale two puffs by mouth into the lungs every 6 hours.  Qty: 12.9 g, Refills: 0    PRESCRIPTION TYPE:  Normal      nitroglycerin (NITROSTAT) 0.4 mg tablet Place one tablet under tongue every 5 minutes as needed for Chest Pain. Max of 3 tablets, call 911.  Qty: 25 tablet, Refills: 0    PRESCRIPTION TYPE:  Normal      OLANZapine (ZYPREXA) 5 mg tablet Take one-half tablet by mouth at bedtime daily.  Qty: 15 tablet, Refills: 0    PRESCRIPTION TYPE:  Normal      omeprazole DR (PRILOSEC) 20 mg capsule Take one capsule by mouth daily before breakfast.  Qty: 30 capsule, Refills: 0    PRESCRIPTION TYPE:  Normal      sodium chloride (SEA MIST) 0.65 % nasal spray Apply one spray to two sprays to each nostril as directed as Needed.  Qty: 104 mL, Refills: 0    PRESCRIPTION TYPE:  Normal      thiamine HCL (VITAMIN B-1) 100 mg tablet Take one tablet by mouth daily.  Qty: 30 tablet, Refills: 0    PRESCRIPTION TYPE:  Normal      vitamins, multiple (TAB-A-VITE) tablet Take one tablet by mouth daily.  Qty: 90 tablet, Refills: 0    PRESCRIPTION TYPE:  Normal          CONTINUE these medications which have NOT CHANGED    Details   albuterol 0.5% (PROVENTIL) 2.5 mg/0.5 mL nebulizer solution Inhale 0.5 mL solution by nebulizer as directed every 6 hours as needed for Shortness of Breath or Wheezing.  Qty: 60 vial, Refills: 1    PRESCRIPTION TYPE:  Normal  aspirin 81 mg chewable tablet Chew one tablet by mouth daily. Take with food. Qty: 90 tablet, Refills: 3    PRESCRIPTION TYPE:  Normal      atorvastatin (LIPITOR) 40 mg tablet Take one tablet by mouth daily.  Qty: 30 tablet, Refills: 3    PRESCRIPTION TYPE:  Normal      benztropine (COGENTIN) 1 mg tablet Take one tablet by mouth at bedtime as needed. Extrapyramidal symptoms  Qty: 30 tablet, Refills: 3    PRESCRIPTION TYPE:  Normal      !! clonazePAM (KLONOPIN) 0.5 mg tablet Take 0.5 mg by mouth twice daily. At 8am and 2 pm    PRESCRIPTION TYPE:  Historical Med      !! clonazePAM (KLONOPIN) 1 mg tablet Take 1 mg by mouth at bedtime daily.    PRESCRIPTION TYPE:  Historical Med      clopiDOGrel (PLAVIX) 75 mg tablet Take one tablet by mouth daily.  Qty: 30 tablet, Refills: 3    PRESCRIPTION TYPE:  Normal      hydrOXYzine (ATARAX) 25 mg tablet Take one tablet by mouth every 3 hours as needed for Anxiety.  Qty: 90 tablet, Refills: 1    PRESCRIPTION TYPE:  Normal      polyethylene glycol 3350 (MIRALAX) 17 g packet Take one packet by mouth daily.  Qty: 12 each, Refills: 1    PRESCRIPTION TYPE:  Normal      QUEtiapine (SEROQUEL) 200 mg tablet Take one tablet by mouth at bedtime daily.  Qty: 30 tablet, Refills: 3    PRESCRIPTION TYPE:  Normal       !! - Potential duplicate medications found. Please discuss with provider.       The following medications were removed from your list. This list includes medications discontinued this stay and those removed from your prior med list in our system        gabapentin (NEURONTIN) 300 mg capsule        metoprolol tartrate (LOPRESSOR) 25 mg tablet            Scheduled appointments:     Please follow up with your primary care physician in the next 7 days               Signed:  Selyna Klahn, DO  02/20/2019      cc:  Primary Care Physician:  No Pcp, Na   Verified  Referring physicians:  Self, Referral   Additional provider(s):

## 2019-03-09 ENCOUNTER — Encounter: Admit: 2019-03-09 | Discharge: 2019-03-09

## 2019-03-27 ENCOUNTER — Encounter: Admit: 2019-03-27 | Discharge: 2019-03-27

## 2019-04-15 ENCOUNTER — Encounter: Admit: 2019-04-15 | Discharge: 2019-04-15

## 2019-05-10 ENCOUNTER — Encounter: Admit: 2019-05-10 | Discharge: 2019-05-10

## 2019-05-10 DIAGNOSIS — I2510 Atherosclerotic heart disease of native coronary artery without angina pectoris: Secondary | ICD-10-CM

## 2019-05-10 DIAGNOSIS — E785 Hyperlipidemia, unspecified: Secondary | ICD-10-CM

## 2019-05-10 MED ORDER — THIAMINE/FOLIC ACID IVPB
Freq: Once | INTRAVENOUS | 0 refills | Status: CP
Start: 2019-05-10 — End: ?
  Administered 2019-05-11 (×3): 50.000 mL via INTRAVENOUS

## 2019-05-10 MED ORDER — BANANA BAG (~~LOC~~) 1000ML
Freq: Once | INTRAVENOUS | 0 refills | Status: DC
Start: 2019-05-10 — End: 2019-05-11

## 2019-05-10 MED ORDER — LORAZEPAM 2 MG/ML IJ SOLN
.5 mg | Freq: Once | INTRAVENOUS | 0 refills | Status: CP
Start: 2019-05-10 — End: ?
  Administered 2019-05-11: 05:00:00 0.5 mg via INTRAVENOUS

## 2019-05-11 ENCOUNTER — Encounter: Admit: 2019-05-11 | Discharge: 2019-05-11 | Payer: 59

## 2019-05-11 ENCOUNTER — Emergency Department: Admit: 2019-05-11 | Discharge: 2019-05-11 | Payer: 59

## 2019-05-11 ENCOUNTER — Inpatient Hospital Stay: Admit: 2019-05-11 | Discharge: 2019-05-15 | Disposition: A | Discharge status: Home / Self Care | Payer: 59

## 2019-05-11 ENCOUNTER — Emergency Department: Admit: 2019-05-11 | Discharge: 2019-05-10

## 2019-05-11 LAB — URINALYSIS MICROSCOPIC REFLEX TO CULTURE

## 2019-05-11 LAB — COMPREHENSIVE METABOLIC PANEL
Lab: 0.3 mg/dL (ref 0.3–1.2)
Lab: 107 MMOL/L (ref 98–110)
Lab: 13 10*3/uL — ABNORMAL HIGH (ref 3–12)
Lab: 139 MMOL/L (ref 137–147)
Lab: 19 MMOL/L — ABNORMAL LOW (ref 21–30)
Lab: 4 g/dL (ref 3.5–5.0)
Lab: 4.2 MMOL/L (ref 3.5–5.1)
Lab: 57 mL/min — ABNORMAL LOW (ref >60)
Lab: 6.8 g/dL (ref 6.0–8.0)
Lab: 9.1 mg/dL — ABNORMAL HIGH (ref 8.5–10.6)
Lab: 92 U/L (ref 25–110)
Lab: 0.5 mg/dL (ref 0.3–1.2)
Lab: 1.1 mg/dL (ref 0.4–1.24)
Lab: 107 MMOL/L — ABNORMAL LOW (ref 98–110)
Lab: 12 U/L (ref 7–56)
Lab: 12 mg/dL (ref 7–25)
Lab: 138 MMOL/L (ref 137–147)
Lab: 3.8 g/dL (ref 3.5–5.0)
Lab: 4.2 MMOL/L — ABNORMAL LOW (ref 3.5–5.1)
Lab: 6.5 g/dL (ref 6.0–8.0)
Lab: 8 10*3/uL (ref 3–12)
Lab: 8.8 mg/dL — ABNORMAL HIGH (ref 8.5–10.6)
Lab: 87 mg/dL (ref 70–100)
Lab: 98 U/L (ref 25–110)
Lab: >60 mL/min (ref >60)

## 2019-05-11 LAB — COVID-19 (SARS-COV-2) PCR

## 2019-05-11 LAB — COCAINE-URINE RANDOM: Lab: POSITIVE — AB

## 2019-05-11 LAB — URINALYSIS DIPSTICK REFLEX TO CULTURE
Lab: NEGATIVE
Lab: NEGATIVE
Lab: NEGATIVE
Lab: NEGATIVE

## 2019-05-11 LAB — CBC AND DIFF
Lab: 0 10*3/uL (ref 0–0.20)
Lab: 0.1 10*3/uL (ref 0–0.45)
Lab: 7.5 10*3/uL (ref 4.5–11.0)
Lab: 21 — ABNORMAL HIGH (ref <20.7)
Lab: 0 10*3/uL (ref 0–0.20)
Lab: 0.1 10*3/uL (ref 0–0.45)
Lab: 6.5 10*3/uL (ref 4.5–11.0)
Lab: 17 (ref <20.7)

## 2019-05-11 LAB — BETA HYDROXYBUTYRATE (KETONES): Lab: 0.2 MMOL/L (ref <0.3)

## 2019-05-11 LAB — TSH WITH FREE T4 REFLEX: Lab: 1.6 uU/mL — ABNORMAL HIGH (ref 0.35–5.00)

## 2019-05-11 LAB — ALCOHOL LEVEL: Lab: <10 mg/dL (ref 32.0–36.0)

## 2019-05-11 LAB — BENZODIAZEPINES-URINE RANDOM: Lab: NEGATIVE

## 2019-05-11 LAB — BARBITURATES-URINE RANDOM: Lab: NEGATIVE

## 2019-05-11 LAB — POC GLUCOSE: Lab: 91 mg/dL (ref 70–100)

## 2019-05-11 LAB — PROTIME INR (PT): Lab: 1 mg/dL — ABNORMAL LOW (ref 0.8–1.2)

## 2019-05-11 LAB — POC CREATININE, RAD: Lab: 1.4 mg/dL — ABNORMAL HIGH (ref 0.4–1.24)

## 2019-05-11 LAB — PTT (APTT): Lab: 28 s (ref 24.0–36.5)

## 2019-05-11 LAB — OPIATES 300 OR GREATER-URINE RANDOM: Lab: NEGATIVE

## 2019-05-11 LAB — OXYCODONE URINE SCREEN: Lab: NEGATIVE

## 2019-05-11 LAB — OSMOLALITY: Lab: 301 mosm/kg (ref 280–307)

## 2019-05-11 LAB — POC LACTATE: Lab: 2.6 MMOL/L — ABNORMAL HIGH (ref 0.5–2.0)

## 2019-05-11 LAB — CANNABINOIDS-URINE RANDOM: Lab: NEGATIVE

## 2019-05-11 LAB — POC TROPONIN: Lab: 0 ng/mL (ref >60)

## 2019-05-11 LAB — METHADONE-URINE SCREEN: Lab: NEGATIVE

## 2019-05-11 LAB — PHENCYCLIDINES-URINE RANDOM: Lab: NEGATIVE

## 2019-05-11 LAB — BNP (B-TYPE NATRIURETIC PEPTI): Lab: 261 pg/mL — ABNORMAL HIGH (ref 0–100)

## 2019-05-11 LAB — AMPHETAMINES-URINE RANDOM: Lab: NEGATIVE

## 2019-05-11 MED ORDER — IPRATROPIUM BROMIDE 17 MCG/ACTUATION IN HFAA
2 | RESPIRATORY_TRACT | 0 refills | Status: DC
Start: 2019-05-11 — End: 2019-05-11

## 2019-05-11 MED ORDER — FLUOXETINE 10 MG PO CAP
20 mg | Freq: Every day | ORAL | 0 refills | Status: DC
Start: 2019-05-11 — End: 2019-05-15
  Administered 2019-05-11 – 2019-05-15 (×5): 20 mg via ORAL

## 2019-05-11 MED ORDER — CETIRIZINE 10 MG PO TAB
10 mg | Freq: Every morning | ORAL | 0 refills | Status: DC
Start: 2019-05-11 — End: 2019-05-15
  Administered 2019-05-11 – 2019-05-15 (×5): 10 mg via ORAL

## 2019-05-11 MED ORDER — FOLIC ACID IN NS SYR
1 mg | Freq: Every day | INTRAVENOUS | 0 refills | Status: CP
Start: 2019-05-11 — End: ?
  Administered 2019-05-12 – 2019-05-14 (×6): 1 mg via INTRAVENOUS

## 2019-05-11 MED ORDER — CLONAZEPAM 0.5 MG PO TAB
.25 mg | Freq: Two times a day (BID) | ORAL | 0 refills | Status: DC | PRN
Start: 2019-05-11 — End: 2019-05-12
  Administered 2019-05-11 – 2019-05-12 (×2): 0.25 mg via ORAL

## 2019-05-11 MED ORDER — LACTATED RINGERS IV SOLP
INTRAVENOUS | 0 refills | Status: AC
Start: 2019-05-11 — End: ?
  Administered 2019-05-11 – 2019-05-13 (×4): 1000.000 mL via INTRAVENOUS

## 2019-05-11 MED ORDER — FLUTICASONE PROPIONATE 50 MCG/ACTUATION NA SPSN
2 | Freq: Every day | NASAL | 0 refills | Status: DC
Start: 2019-05-11 — End: 2019-05-15
  Administered 2019-05-12: 02:00:00 2 via NASAL

## 2019-05-11 MED ORDER — CLOPIDOGREL 75 MG PO TAB
75 mg | Freq: Every day | ORAL | 0 refills | Status: DC
Start: 2019-05-11 — End: 2019-05-15
  Administered 2019-05-11 – 2019-05-15 (×5): 75 mg via ORAL

## 2019-05-11 MED ORDER — TRAMADOL 50 MG PO TAB
50 mg | ORAL | 0 refills | Status: DC | PRN
Start: 2019-05-11 — End: 2019-05-15
  Administered 2019-05-12 – 2019-05-13 (×2): 50 mg via ORAL

## 2019-05-11 MED ORDER — BUSPIRONE 5 MG PO TAB
5 mg | Freq: Three times a day (TID) | ORAL | 0 refills | Status: DC
Start: 2019-05-11 — End: 2019-05-15
  Administered 2019-05-11 – 2019-05-15 (×13): 5 mg via ORAL

## 2019-05-11 MED ORDER — ATORVASTATIN 40 MG PO TAB
40 mg | Freq: Every day | ORAL | 0 refills | Status: DC
Start: 2019-05-11 — End: 2019-05-15
  Administered 2019-05-11 – 2019-05-15 (×5): 40 mg via ORAL

## 2019-05-11 MED ORDER — SODIUM CHLORIDE 0.9 % IV SOLP
500 mL | INTRAVENOUS | 0 refills | Status: CP
Start: 2019-05-11 — End: ?
  Administered 2019-05-11: 16:00:00 500 mL via INTRAVENOUS

## 2019-05-11 MED ORDER — FLUTICASONE PROPION-SALMETEROL 250-50 MCG/DOSE IN DSDV
1 | Freq: Two times a day (BID) | RESPIRATORY_TRACT | 0 refills | Status: DC
Start: 2019-05-11 — End: 2019-05-15
  Administered 2019-05-11 (×2): 1 via RESPIRATORY_TRACT

## 2019-05-11 MED ORDER — ASPIRIN 81 MG PO CHEW
81 mg | Freq: Every day | ORAL | 0 refills | Status: DC
Start: 2019-05-11 — End: 2019-05-15
  Administered 2019-05-11 – 2019-05-15 (×5): 81 mg via ORAL

## 2019-05-11 MED ORDER — LORAZEPAM 2 MG/ML IJ SOLN
1-2 mg | INTRAVENOUS | 0 refills | Status: DC | PRN
Start: 2019-05-11 — End: 2019-05-15
  Administered 2019-05-12 (×3): 1 mg via INTRAVENOUS

## 2019-05-11 MED ORDER — FOLIC ACID 5 MG/ML IJ SOLN
1 mg | INTRAMUSCULAR | 0 refills | Status: AC
Start: 2019-05-11 — End: ?

## 2019-05-11 MED ORDER — LORAZEPAM 2 MG/ML IJ SOLN
2 mg | INTRAVENOUS | 0 refills | Status: AC
Start: 2019-05-11 — End: ?
  Administered 2019-05-11 – 2019-05-13 (×11): 2 mg via INTRAVENOUS

## 2019-05-11 MED ORDER — THIAMINE MONONITRATE (VIT B1) 100 MG PO TAB
100 mg | Freq: Every day | ORAL | 0 refills | Status: DC
Start: 2019-05-11 — End: 2019-05-15
  Administered 2019-05-15: 14:00:00 100 mg via ORAL

## 2019-05-11 MED ORDER — LORAZEPAM 2 MG/ML IJ SOLN
1 mg | INTRAVENOUS | 0 refills | Status: CP
Start: 2019-05-11 — End: ?
  Administered 2019-05-13 – 2019-05-14 (×4): 1 mg via INTRAVENOUS

## 2019-05-11 MED ORDER — THIAMINE HCL (VITAMIN B1) 100 MG/ML IJ SOLN
100 mg | INTRAMUSCULAR | 0 refills | Status: AC
Start: 2019-05-11 — End: ?

## 2019-05-11 MED ORDER — ALBUTEROL SULFATE 90 MCG/ACTUATION IN HFAA
2 | RESPIRATORY_TRACT | 0 refills | Status: DC | PRN
Start: 2019-05-11 — End: 2019-05-15
  Administered 2019-05-11: 15:00:00 2 via RESPIRATORY_TRACT

## 2019-05-11 MED ORDER — LORAZEPAM 2 MG/ML IJ SOLN
1 mg | Freq: Two times a day (BID) | INTRAVENOUS | 0 refills | Status: DC
Start: 2019-05-11 — End: 2019-05-15

## 2019-05-11 MED ORDER — ACETAMINOPHEN 325 MG PO TAB
650 mg | ORAL | 0 refills | Status: DC | PRN
Start: 2019-05-11 — End: 2019-05-15
  Administered 2019-05-11 – 2019-05-15 (×10): 650 mg via ORAL

## 2019-05-11 MED ORDER — THIAMINE 100MG IVPB
100 mg | Freq: Every day | INTRAVENOUS | 0 refills | Status: CP
Start: 2019-05-11 — End: ?
  Administered 2019-05-12 – 2019-05-14 (×6): 100 mg via INTRAVENOUS

## 2019-05-11 MED ORDER — LORAZEPAM 2 MG/ML IJ SOLN
1 mg | INTRAVENOUS | 0 refills | Status: CP
Start: 2019-05-11 — End: ?
  Administered 2019-05-14 – 2019-05-15 (×3): 1 mg via INTRAVENOUS

## 2019-05-11 MED ORDER — FOLIC ACID 1 MG PO TAB
1 mg | Freq: Every day | ORAL | 0 refills | Status: DC
Start: 2019-05-11 — End: 2019-05-15
  Administered 2019-05-15: 14:00:00 1 mg via ORAL

## 2019-05-11 MED ORDER — ENOXAPARIN 40 MG/0.4 ML SC SYRG
40 mg | Freq: Every day | SUBCUTANEOUS | 0 refills | Status: DC
Start: 2019-05-11 — End: 2019-05-15
  Administered 2019-05-12 – 2019-05-15 (×4): 40 mg via SUBCUTANEOUS

## 2019-05-11 MED ORDER — QUETIAPINE 100 MG PO TAB
200 mg | Freq: Every evening | ORAL | 0 refills | Status: DC
Start: 2019-05-11 — End: 2019-05-12

## 2019-05-11 MED ORDER — HYDROXYZINE HCL 50 MG PO TAB
25 mg | Freq: Three times a day (TID) | ORAL | 0 refills | Status: DC | PRN
Start: 2019-05-11 — End: 2019-05-15
  Administered 2019-05-11 – 2019-05-15 (×7): 25 mg via ORAL

## 2019-05-12 ENCOUNTER — Encounter: Admit: 2019-05-12 | Discharge: 2019-05-12 | Payer: 59

## 2019-05-12 ENCOUNTER — Inpatient Hospital Stay: Admit: 2019-05-12 | Discharge: 2019-05-12 | Payer: 59

## 2019-05-12 LAB — COMPREHENSIVE METABOLIC PANEL
Lab: 140 MMOL/L — ABNORMAL LOW (ref 137–147)
Lab: 4.1 MMOL/L — ABNORMAL HIGH (ref >60)

## 2019-05-12 LAB — CBC AND DIFF: Lab: 5.2 K/UL — ABNORMAL LOW (ref 4.5–11.0)

## 2019-05-12 LAB — LACTIC ACID(LACTATE): Lab: 1 MMOL/L (ref 0.5–2.0)

## 2019-05-12 MED ORDER — MAGNESIUM CITRATE PO SOLN
296 mL | Freq: Once | ORAL | 0 refills | Status: CP
Start: 2019-05-12 — End: ?
  Administered 2019-05-12: 20:00:00 296 mL via ORAL

## 2019-05-12 MED ORDER — LIDOCAINE 5 % TP PTMD
1 | Freq: Every day | TOPICAL | 0 refills | Status: DC
Start: 2019-05-12 — End: 2019-05-15
  Administered 2019-05-12 – 2019-05-15 (×4): 1 via TOPICAL

## 2019-05-12 MED ORDER — PATCH DOCUMENTATION - LIDOCAINE 5%
Freq: Two times a day (BID) | TRANSDERMAL | 0 refills | Status: DC
Start: 2019-05-12 — End: 2019-05-15

## 2019-05-13 LAB — COMPREHENSIVE METABOLIC PANEL: Lab: 137 MMOL/L — ABNORMAL LOW (ref 137–147)

## 2019-05-13 LAB — CBC AND DIFF: Lab: 5.5 10*3/uL — ABNORMAL LOW (ref >60)

## 2019-05-13 LAB — PHOSPHORUS: Lab: 4 mg/dL (ref 2.0–4.5)

## 2019-05-13 MED ORDER — FLUTICASONE PROPIONATE 50 MCG/ACTUATION NA SPSN
2 | Freq: Two times a day (BID) | NASAL | 0 refills | Status: DC | PRN
Start: 2019-05-13 — End: 2019-05-15
  Administered 2019-05-14: 03:00:00 2 via NASAL

## 2019-05-14 LAB — COMPREHENSIVE METABOLIC PANEL: Lab: 139 MMOL/L — ABNORMAL LOW (ref >60)

## 2019-05-14 LAB — CBC AND DIFF: Lab: 6.1 K/UL — ABNORMAL LOW (ref >60)

## 2019-05-14 LAB — PHOSPHORUS: Lab: 4.1 mg/dL — ABNORMAL LOW (ref >60)

## 2019-05-14 MED ORDER — THIAMINE HCL (VITAMIN B1) 100 MG PO TAB
100 mg | ORAL_TABLET | Freq: Every day | ORAL | 1 refills | Status: CN
Start: 2019-05-14 — End: ?

## 2019-05-14 MED ADMIN — SODIUM CHLORIDE 0.9 % IV SOLP [27838]: 250 mL | INTRAVENOUS | @ 15:00:00 | Stop: 2019-05-14 | NDC 00338004902

## 2019-05-15 ENCOUNTER — Encounter: Admit: 2019-05-15 | Discharge: 2019-05-15 | Payer: 59

## 2019-05-15 LAB — COMPREHENSIVE METABOLIC PANEL: Lab: 137 MMOL/L (ref >60)

## 2019-05-15 LAB — CBC AND DIFF: Lab: 7.3 K/UL — ABNORMAL HIGH (ref 4.5–11.0)

## 2019-05-15 LAB — PHOSPHORUS: Lab: 4.8 mg/dL — ABNORMAL HIGH (ref >60)

## 2019-05-15 IMAGING — CR CHEST
1 series · 1 of 1 positions shown · non-contrast
Comparison: none

[chest port x-wise]
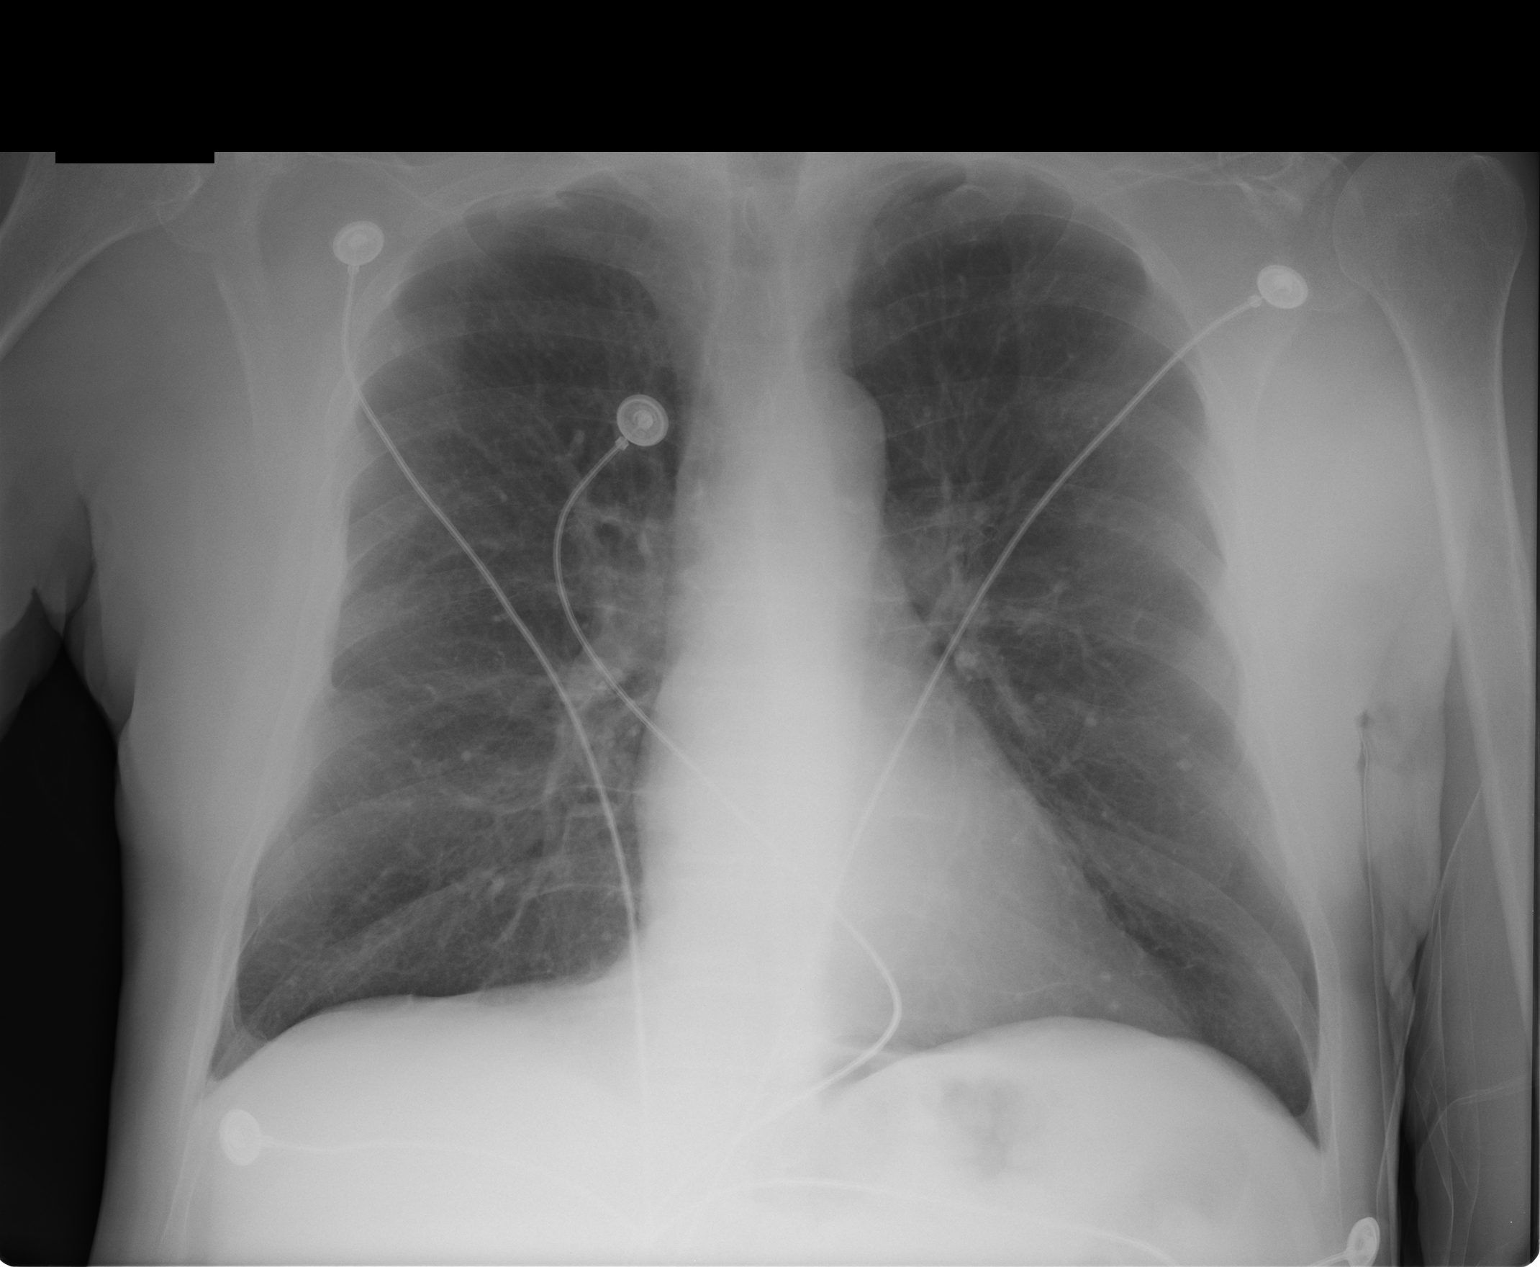

[1 of 1 positions shown; findings below may reference images not displayed]

DIAGNOSTIC STUDIES

EXAM
Chest.

INDICATION
RUCHIKA

TECHNIQUE
AP chest.

COMPARISONS
August 29, 2017.

FINDINGS
Mild pulmonary hyperinflation. No consolidative airspace disease, pleural effusion, or
pneumothorax. Evidence of old granulomatous infection.

IMPRESSION
Evidence of COPD. No acute cardiopulmonary pathology.

Tech Notes:

## 2019-05-15 MED ORDER — CLOPIDOGREL 75 MG PO TAB
75 mg | ORAL_TABLET | Freq: Every day | ORAL | 1 refills | 90.00000 days | Status: AC
Start: 2019-05-15 — End: ?
  Filled 2019-05-15: qty 30, 30d supply, fill #1

## 2019-05-15 MED ORDER — BUSPIRONE 5 MG PO TAB
5 mg | ORAL_TABLET | Freq: Three times a day (TID) | ORAL | 1 refills | Status: DC
Start: 2019-05-15 — End: 2019-06-11
  Filled 2019-05-15: qty 90, 30d supply, fill #1

## 2019-05-15 MED ORDER — ASPIRIN 81 MG PO CHEW
81 mg | ORAL_TABLET | Freq: Every day | ORAL | 3 refills | Status: CN
Start: 2019-05-15 — End: ?

## 2019-05-15 MED ORDER — FOLIC ACID 1 MG PO TAB
1 mg | ORAL_TABLET | Freq: Every day | ORAL | 1 refills | Status: AC
Start: 2019-05-15 — End: ?
  Filled 2019-05-15: qty 30, 30d supply, fill #1

## 2019-05-15 MED ORDER — PANTOPRAZOLE 20 MG PO TBEC
20 mg | ORAL_TABLET | Freq: Every day | ORAL | 1 refills | 90.00000 days | Status: DC
Start: 2019-05-15 — End: 2019-06-11
  Filled 2019-05-15: qty 30, 30d supply, fill #1

## 2019-05-15 MED ORDER — FLUTICASONE FUROATE-VILANTEROL 100-25 MCG/DOSE IN DSDV
1 | Freq: Every day | RESPIRATORY_TRACT | 1 refills | Status: AC
Start: 2019-05-15 — End: ?
  Filled 2019-05-15: qty 60, 30d supply, fill #1

## 2019-06-09 ENCOUNTER — Emergency Department: Admit: 2019-06-09 | Discharge: 2019-06-09 | Payer: 59

## 2019-06-09 ENCOUNTER — Observation Stay: Admit: 2019-06-09 | Discharge: 2019-06-09 | Payer: 59

## 2019-06-09 ENCOUNTER — Encounter: Admit: 2019-06-09 | Discharge: 2019-06-09 | Payer: 59

## 2019-06-09 DIAGNOSIS — I2510 Atherosclerotic heart disease of native coronary artery without angina pectoris: Secondary | ICD-10-CM

## 2019-06-09 DIAGNOSIS — R0602 Shortness of breath: Secondary | ICD-10-CM

## 2019-06-09 DIAGNOSIS — E785 Hyperlipidemia, unspecified: Secondary | ICD-10-CM

## 2019-06-09 LAB — TSH WITH FREE T4 REFLEX: Lab: 1 uU/mL (ref 0.35–5.00)

## 2019-06-09 LAB — PTT (APTT): Lab: 29 s — ABNORMAL HIGH (ref 24.0–36.5)

## 2019-06-09 LAB — BARBITURATES-URINE RANDOM: Lab: NEGATIVE

## 2019-06-09 LAB — COMPREHENSIVE METABOLIC PANEL
Lab: 0.4 mg/dL (ref 0.3–1.2)
Lab: 10 mg/dL — ABNORMAL HIGH (ref 8.5–10.6)
Lab: 103 MMOL/L — ABNORMAL LOW (ref 98–110)
Lab: 129 U/L — ABNORMAL HIGH (ref 25–110)
Lab: 138 MMOL/L — ABNORMAL HIGH (ref 137–147)
Lab: 24 MMOL/L (ref >60)
Lab: 4.5 MMOL/L — ABNORMAL HIGH (ref 3.5–5.1)
Lab: 4.7 g/dL (ref 3.5–5.0)
Lab: 75 mg/dL (ref 70–100)
Lab: 8.5 g/dL — ABNORMAL HIGH (ref 6.0–8.0)

## 2019-06-09 LAB — D-DIMER: Lab: 363 ng{FEU}/mL (ref <500)

## 2019-06-09 LAB — CREATINE KINASE-CPK: Lab: 52 U/L (ref 35–232)

## 2019-06-09 LAB — PHENCYCLIDINES-URINE RANDOM: Lab: NEGATIVE

## 2019-06-09 LAB — CANNABINOIDS-URINE RANDOM: Lab: NEGATIVE

## 2019-06-09 LAB — POC TROPONIN
Lab: 0 ng/mL (ref 0.00–0.05)
Lab: 0 ng/mL (ref 0.00–0.05)

## 2019-06-09 LAB — AMPHETAMINES-URINE RANDOM: Lab: NEGATIVE

## 2019-06-09 LAB — POC CREATININE, RAD: Lab: 1.4 mg/dL — ABNORMAL HIGH (ref 0.4–1.24)

## 2019-06-09 LAB — ALCOHOL LEVEL: Lab: <10 mg/dL

## 2019-06-09 LAB — OPIATES-URINE RANDOM: Lab: NEGATIVE

## 2019-06-09 LAB — BNP POC ER: Lab: <15 pg/mL (ref 0–100)

## 2019-06-09 LAB — COCAINE-URINE RANDOM: Lab: POSITIVE — AB

## 2019-06-09 LAB — CBC AND DIFF: Lab: 9.9 K/UL (ref 4.5–11.0)

## 2019-06-09 LAB — BENZODIAZEPINES-URINE RANDOM: Lab: NEGATIVE

## 2019-06-09 LAB — PROTIME INR (PT): Lab: 1 mg/dL (ref 0.8–1.2)

## 2019-06-09 MED ORDER — BUSPIRONE 5 MG PO TAB
5 mg | Freq: Three times a day (TID) | ORAL | 0 refills | Status: DC
Start: 2019-06-09 — End: 2019-06-15
  Administered 2019-06-10 – 2019-06-15 (×18): 5 mg via ORAL

## 2019-06-09 MED ORDER — CLONAZEPAM 0.5 MG PO TAB
.5 mg | Freq: Two times a day (BID) | ORAL | 0 refills | Status: DC
Start: 2019-06-09 — End: 2019-06-20
  Administered 2019-06-10 – 2019-06-20 (×22): 0.5 mg via ORAL

## 2019-06-09 MED ORDER — FLUOXETINE 10 MG PO CAP
20 mg | Freq: Every day | ORAL | 0 refills | Status: DC
Start: 2019-06-09 — End: 2019-06-20
  Administered 2019-06-10 – 2019-06-20 (×12): 20 mg via ORAL

## 2019-06-09 MED ORDER — CLOPIDOGREL 75 MG PO TAB
75 mg | Freq: Every day | ORAL | 0 refills | Status: DC
Start: 2019-06-09 — End: 2019-06-20
  Administered 2019-06-10 – 2019-06-20 (×12): 75 mg via ORAL

## 2019-06-09 MED ORDER — FLUTICASONE PROPION-SALMETEROL 250-50 MCG/DOSE IN DSDV
1 | Freq: Two times a day (BID) | RESPIRATORY_TRACT | 0 refills | Status: DC
Start: 2019-06-09 — End: 2019-06-20
  Administered 2019-06-10 – 2019-06-11 (×2): 1 via RESPIRATORY_TRACT

## 2019-06-09 MED ORDER — ONDANSETRON HCL (PF) 4 MG/2 ML IJ SOLN
4 mg | INTRAVENOUS | 0 refills | Status: DC | PRN
Start: 2019-06-09 — End: 2019-06-20

## 2019-06-09 MED ORDER — FOLIC ACID 1 MG PO TAB
1 mg | Freq: Every day | ORAL | 0 refills | Status: DC
Start: 2019-06-09 — End: 2019-06-10

## 2019-06-09 MED ORDER — SODIUM CHLORIDE 0.9 % IV SOLP
INTRAVENOUS | 0 refills | Status: AC
Start: 2019-06-09 — End: ?
  Administered 2019-06-09 – 2019-06-11 (×5): 1000.000 mL via INTRAVENOUS

## 2019-06-09 MED ORDER — ATORVASTATIN 40 MG PO TAB
40 mg | Freq: Every day | ORAL | 0 refills | Status: DC
Start: 2019-06-09 — End: 2019-06-20
  Administered 2019-06-10 – 2019-06-20 (×11): 40 mg via ORAL

## 2019-06-09 MED ORDER — LORAZEPAM 2 MG/ML IJ SOLN
.5-1 mg | Freq: Once | INTRAVENOUS | 0 refills | Status: CP
Start: 2019-06-09 — End: ?
  Administered 2019-06-09: 17:00:00 0.5 mg via INTRAVENOUS

## 2019-06-09 MED ORDER — THIAMINE MONONITRATE (VIT B1) 100 MG PO TAB
100 mg | Freq: Every day | ORAL | 0 refills | Status: DC
Start: 2019-06-09 — End: 2019-06-10

## 2019-06-09 MED ORDER — ACETAMINOPHEN 325 MG PO TAB
650 mg | ORAL | 0 refills | Status: DC | PRN
Start: 2019-06-09 — End: 2019-06-20
  Administered 2019-06-13 – 2019-06-16 (×3): 650 mg via ORAL

## 2019-06-09 MED ORDER — CLONAZEPAM 1 MG PO TAB
1 mg | Freq: Once | ORAL | 0 refills | Status: CP
Start: 2019-06-09 — End: ?
  Administered 2019-06-09: 21:00:00 1 mg via ORAL

## 2019-06-09 MED ORDER — ENOXAPARIN 40 MG/0.4 ML SC SYRG
40 mg | Freq: Every day | SUBCUTANEOUS | 0 refills | Status: DC
Start: 2019-06-09 — End: 2019-06-20
  Administered 2019-06-10 – 2019-06-20 (×11): 40 mg via SUBCUTANEOUS

## 2019-06-09 MED ORDER — THIAMINE/FOLIC ACID IVPB
Freq: Once | INTRAVENOUS | 0 refills | Status: CP
Start: 2019-06-09 — End: ?
  Administered 2019-06-09 (×3): 50.000 mL via INTRAVENOUS

## 2019-06-09 MED ORDER — FLUTICASONE PROPIONATE 50 MCG/ACTUATION NA SPSN
2 | Freq: Two times a day (BID) | NASAL | 0 refills | Status: DC
Start: 2019-06-09 — End: 2019-06-20
  Administered 2019-06-10: 04:00:00 2 via NASAL

## 2019-06-09 MED ORDER — CALCIUM CARBONATE 200 MG CALCIUM (500 MG) PO CHEW
500 mg | ORAL | 0 refills | Status: DC | PRN
Start: 2019-06-09 — End: 2019-06-20

## 2019-06-09 MED ORDER — ONDANSETRON HCL 4 MG PO TAB
4 mg | ORAL | 0 refills | Status: DC | PRN
Start: 2019-06-09 — End: 2019-06-20
  Administered 2019-06-12: 13:00:00 4 mg via ORAL

## 2019-06-09 MED ORDER — BENZONATATE 100 MG PO CAP
100 mg | Freq: Three times a day (TID) | ORAL | 0 refills | Status: DC | PRN
Start: 2019-06-09 — End: 2019-06-20
  Administered 2019-06-10 – 2019-06-12 (×2): 100 mg via ORAL

## 2019-06-09 MED ORDER — MELATONIN 3 MG PO TAB
3 mg | Freq: Every evening | ORAL | 0 refills | Status: DC | PRN
Start: 2019-06-09 — End: 2019-06-20
  Administered 2019-06-10 – 2019-06-20 (×7): 3 mg via ORAL

## 2019-06-09 MED ORDER — PANTOPRAZOLE 20 MG PO TBEC
20 mg | Freq: Every day | ORAL | 0 refills | Status: DC
Start: 2019-06-09 — End: 2019-06-20
  Administered 2019-06-10 – 2019-06-20 (×12): 20 mg via ORAL

## 2019-06-09 MED ORDER — LACTATED RINGERS IV SOLP
1000 mL | Freq: Once | INTRAVENOUS | 0 refills | Status: CP
Start: 2019-06-09 — End: ?
  Administered 2019-06-09: 17:00:00 1000 mL via INTRAVENOUS

## 2019-06-09 MED ORDER — THIAMINE MONONITRATE (VIT B1) 100 MG PO TAB
100 mg | Freq: Every day | ORAL | 0 refills | Status: DC
Start: 2019-06-09 — End: 2019-06-20
  Administered 2019-06-10 – 2019-06-20 (×11): 100 mg via ORAL

## 2019-06-09 MED ORDER — ASPIRIN 81 MG PO CHEW
81 mg | Freq: Every day | ORAL | 0 refills | Status: DC
Start: 2019-06-09 — End: 2019-06-20
  Administered 2019-06-10 – 2019-06-20 (×12): 81 mg via ORAL

## 2019-06-09 MED ORDER — FOLIC ACID 1 MG PO TAB
1 mg | Freq: Every day | ORAL | 0 refills | Status: DC
Start: 2019-06-09 — End: 2019-06-20
  Administered 2019-06-10 – 2019-06-20 (×11): 1 mg via ORAL

## 2019-06-09 MED ORDER — ALBUTEROL SULFATE 90 MCG/ACTUATION IN HFAA
1-2 | RESPIRATORY_TRACT | 0 refills | Status: DC | PRN
Start: 2019-06-09 — End: 2019-06-10
  Administered 2019-06-10: 02:00:00 2 via RESPIRATORY_TRACT

## 2019-06-09 MED ORDER — LORAZEPAM 2 MG/ML IJ SOLN
.5 mg | Freq: Once | INTRAVENOUS | 0 refills | Status: CP
Start: 2019-06-09 — End: ?

## 2019-06-09 MED ORDER — DOCUSATE SODIUM 100 MG PO CAP
100 mg | Freq: Every day | ORAL | 0 refills | Status: DC | PRN
Start: 2019-06-09 — End: 2019-06-20
  Administered 2019-06-10: 04:00:00 100 mg via ORAL

## 2019-06-09 MED ADMIN — LORAZEPAM 2 MG/ML IJ SOLN [10467]: 0.5 mg | INTRAVENOUS | @ 17:00:00 | Stop: 2019-06-09 | NDC 00641604401

## 2019-06-09 NOTE — Consults
PSYCHIATRY CONSULT NOTE    Room/Bed: ED29/29  Admission Date:     06/09/2019                                                LOS: 0 days    Consult type: Opinion with orders   Reason for Consult:      depression, anxiety, panic    Assessment:  1. Generalized anxiety disorder with panic attacks  2. Cocaine abuse, severity unknown  3. Alcohol use disorder, moderate  4. Tobacco use disorder, in sustained remission    Recommendations:      ? Recommend restarting PTA Clonazepam at a lower dose - 0.5 mg BID, as patient has been off his PTA regimen of 0.5 mg BID and 1 mg qHS x 1 week  ? Recommend SSRI for first line treatment of anxiety but patient declined as he reports several failed trials in the past  ? Patient reports upcoming follow up appointment with prescribing PCP Dr. Gevena Barre in [place]  ? Please provide discharge summary to PCP with UDS    Reviewed EMR.   Reviewed Laboratory studies.   BAL <10  Cr 1.4  UDS positive for cocaine    QTc 424    Seen and discussed with: Dr. Carlena Bjornstad    Please feel free to contact us with any additional questions or concerns by paging the consult team between 8am and 5pm on weekdays and between 8am and 3pm on weekends at [phone]. Otherwise, page the psychiatry resident on call.  ______________________________________________________________________  Chief Concern:  I'm deathly afraid of mold    History of Present Illness:   Charles Vaughan is a 63 y.o. male with a past psychiatric history of anxiety, alcohol use, and cocaine use who presented to [facility] ED for chest pain and experienced a reported panic attack in triage. Patient reports he is living in a house with a broken sewage pipe in the basement that has lead to standing water and mold. He reports feeling ill with cough and lightheadness for the last 4 days which he contributes to reported mold. Further, he reports he is deathly afraid of the mold.    In terms of depression, he reports depressed mood, feelings of worthlessness, low energy, and appetite changes over the last 4 days as well. Denies history or current symptoms of mania.  With regard to psychosis, he reports hearing voices that are critical in nature and limited to when he is coming down from cocaine use.   Reports excessive worry with associated sleep disruption and fatigue with panic attacks. Panic attacks are characterized as accelerated heart rate, chest pain, hyperventilation, and lightheadedness.    Patient reports the only effective treatment for his anxiety is his current regimen of Clonazepam. Reports past failed trials of Fluoxetine, Sertraline, and Paroxetine.     Access to firearms?  1 gun that belongs to a roommate that is locked away and inaccessible.    Past Psychiatric History:  Outpatient Provider: Dr. Gevena Barre  Diagnoses: anxiety  Medications: Listed in HPI  Psychiatric Hospitalizations: denies  Past Self-Injurious Behaviors: denies  Past Suicide Attempts: denies      Family Psychiatric History:  Maternal grandmother -went crazy unsure of diagnosis    Substance Use:  Tobacco: last use 4 years ago  Alcohol: last drink 2 weeks ago was drinking 12- 12oz beers  daily which is a reduction from past pattern  Marijuana: denies  Cocaine:  Last use 2 weeks ago - unable to quantify use  Heroin: denies  Methamphetamines/Stimulants: denies  Prescription opiates: denies    Psychosocial History:  Lives with roommates. Divorced x2 with 2 adult children. Master's degree in [employer], retired Charity fundraiser for [employer]. Denies trauma history.    Social History     Socioeconomic History   ? Marital status: Single     Spouse name: Not on file   ? Number of children: Not on file   ? Years of education: Not on file   ? Highest education level: Not on file   Occupational History   ? Not on file   Tobacco Use   ? Smoking status: Former Smoker     Last attempt to quit: 06/09/2014     Years since quitting: 5.0 ? Smokeless tobacco: Former Estate agent and Sexual Activity   ? Alcohol use: Yes     Comment: states he drinks often but is unsure how often   ? Drug use: Not Currently     Types: Cocaine     Comment: reports use 2 days ago    ? Sexual activity: Not on file   Other Topics Concern   ? Not on file   Social History Narrative   ? Not on file         Past Medical/Surgical History:  Medical History:   Diagnosis Date   ? CAD (coronary artery disease)    ? Hyperlipidemia    :    Surgical History:   Procedure Laterality Date   ? ANGIOGRAPHY CORONARY ARTERY WITH LEFT HEART CATHETERIZATION N/A 06/13/2018    Performed by Laney Pastor, MD at [facility] CATH LAB   ? PERCUTANEOUS CORONARY STENT PLACEMENT WITH ANGIOPLASTY N/A 06/13/2018    Performed by Laney Pastor, MD at [facility] CATH LAB   :      Home Medications:  (Not in a hospital admission)      Hospital Medications:  Scheduled Meds:[START ON 06/10/2019] folic acid (FOLVITE) tablet 1 mg, 1 mg, Oral, QDAY  LORAZEPAM 2 MG/ML IJ SOLN (Cabinet Override), , , NOW  thiamine (VITAMIN B-1) 500 mg, folic acid 1 mg in sodium chloride 0.9% (NS) 50 mL IVPB, , Intravenous, ONCE  [START ON 06/10/2019] thiamine (VITAMIN B-1) tablet 100 mg, 100 mg, Oral, QDAY    Continuous Infusions:  ? sodium chloride 0.9 %   infusion       PRN and Respiratory Meds:  :    Allergies:  Patient has no known allergies.      Review of Systems:  A 14 point review of systems was negative except for: listed in the HPI      Vital Signs:  Last Filed in 24 hours Vital Signs:  24 hour Range    BP: 132/93 (10/13 1400)  Temp: 37.7 ?C (99.8 ?F) (10/13 1227)  Pulse: 113 (10/13 1400)  Respirations: 25 PER MINUTE (10/13 1400)  SpO2: 94 % (10/13 1400)  SpO2 Pulse: 111 (10/13 1400) BP: (114-140)/(77-122)   Temp:  [37.7 ?C (99.8 ?F)]   Pulse:  [107-153]   Respirations:  [14 PER MINUTE-36 PER MINUTE]   SpO2:  [94 %-100 %]      Mental Status Evaluation:    MENTAL STATUS EXAMINATION ? General/Constitutional: 63 y.o caucasian male appears stated age, dressed in hospital clothes, poor grooming and hygiene  ? Eye Contact:  good  ? Behavior: Calm, cooperative; appropriate for conversation  ? Speech: RRR with normal volume and tone. Good articulation  ? Mood: not too good  ? Affect: euthymic ; mood congruent  ? Thought Process: Linear and goal directed  ? Thought Content: denies SI, HI. No evidence of delusions  ? Perception: Denies AVH  ? Associations: Intact  ? Insight/Judgment: fair/fair    ? Orientation: AAOx3 (name/year/location)  ? Recent and remote memory: grossly intact  ? Attention span and concentration: appropriate for conversation  ? Cognition: average  ? Language: english, fluent  ? Fund of knowledge and vocabulary: average     Physical Exam:  ? Neuro: No gross neurologic deficits.   ? Musculoskeletal: Moves all four extremities spontaneously      Lab/Radiology/Other Diagnostic Tests:  24-hour labs:    Results for orders placed or performed during the hospital encounter of 06/09/19 (from the past 24 hour(s))   POC TROPONIN    Collection Time: 06/09/19 12:00 PM   Result Value Ref Range    Troponin-I-POC 0.03 0.00 - 0.05 NG/ML   CBC AND DIFF    Collection Time: 06/09/19 12:04 PM   Result Value Ref Range    White Blood Cells 9.9 4.5 - 11.0 K/UL    RBC 5.85 (H) 4.4 - 5.5 M/UL    Hemoglobin 16.9 (H) 13.5 - 16.5 GM/DL    Hematocrit 45.4 40 - 50 %    MCV 84.9 80 - 100 FL    MCH 28.9 26 - 34 PG    MCHC 34.0 32.0 - 36.0 G/DL    RDW 09.8 (H) 11 - 15 %    Platelet Count 464 (H) 150 - 400 K/UL    MPV 8.1 7 - 11 FL    Neutrophils 67 41 - 77 %    Lymphocytes 22 (L) 24 - 44 %    Monocytes 8 4 - 12 %    Eosinophils 2 0 - 5 %    Basophils 1 0 - 2 %    Absolute Neutrophil Count 6.60 1.8 - 7.0 K/UL    Absolute Lymph Count 2.21 1.0 - 4.8 K/UL    Absolute Monocyte Count 0.78 0 - 0.80 K/UL    Absolute Eosinophil Count 0.16 0 - 0.45 K/UL    Absolute Basophil Count 0.12 0 - 0.20 K/UL MDW (Monocyte Distribution Width) 17.9 <20.7   COMPREHENSIVE METABOLIC PANEL    Collection Time: 06/09/19 12:04 PM   Result Value Ref Range    Sodium 138 137 - 147 MMOL/L    Potassium 4.5 3.5 - 5.1 MMOL/L    Chloride 103 98 - 110 MMOL/L    Glucose 75 70 - 100 MG/DL    Blood Urea Nitrogen 18 7 - 25 MG/DL    Creatinine 1.19 (H) 0.4 - 1.24 MG/DL    Calcium 14.7 8.5 - 82.9 MG/DL    Total Protein 8.5 (H) 6.0 - 8.0 G/DL    Total Bilirubin 0.4 0.3 - 1.2 MG/DL    Albumin 4.7 3.5 - 5.0 G/DL    Alk Phosphatase 562 (H) 25 - 110 U/L    AST (SGOT) 28 7 - 40 U/L    CO2 24 21 - 30 MMOL/L    ALT (SGPT) 31 7 - 56 U/L    Anion Gap 11 3 - 12    eGFR Non African American 51 (L) >60 mL/min    eGFR African American >60 >60 mL/min   PROTIME INR (PT)  Collection Time: 06/09/19 12:04 PM   Result Value Ref Range    INR 1.0 0.8 - 1.2   PTT (APTT)    Collection Time: 06/09/19 12:04 PM   Result Value Ref Range    APTT 29.5 24.0 - 36.5 SEC   D-DIMER    Collection Time: 06/09/19 12:04 PM   Result Value Ref Range    D-Dimer 363 <500 ng/mL FEU   CREATINE KINASE-CPK    Collection Time: 06/09/19 12:04 PM   Result Value Ref Range    Creatine Kinase 52 35 - 232 U/L   POC CREATININE, RAD    Collection Time: 06/09/19 12:09 PM   Result Value Ref Range    Creatinine, POC 1.4 (H) 0.4 - 1.24 MG/DL   BNP POC ER    Collection Time: 06/09/19 12:13 PM   Result Value Ref Range    BNP POC <15.0 0 - 100 PG/ML   TSH WITH FREE T4 REFLEX    Collection Time: 06/09/19  3:45 PM   Result Value Ref Range    TSH 1.09 0.35 - 5.00 MCU/ML   ALCOHOL LEVEL    Collection Time: 06/09/19  3:45 PM   Result Value Ref Range    Alcohol <10 MG/DL   AMPHETAMINES-URINE RANDOM    Collection Time: 06/09/19  3:46 PM   Result Value Ref Range    Amphetamines NEG NEG-NEG   BARBITURATES-URINE RANDOM    Collection Time: 06/09/19  3:46 PM   Result Value Ref Range    Barbiturates,Urine NEG NEG-NEG   BENZODIAZEPINES-URINE RANDOM    Collection Time: 06/09/19  3:46 PM   Result Value Ref Range Benzodiazepines NEG NEG-NEG   CANNABINOIDS-URINE RANDOM    Collection Time: 06/09/19  3:46 PM   Result Value Ref Range    THC NEG NEG-NEG   COCAINE-URINE RANDOM    Collection Time: 06/09/19  3:46 PM   Result Value Ref Range    Cocaine-Urine POS (A) NEG-NEG   OPIATES-URINE RANDOM    Collection Time: 06/09/19  3:46 PM   Result Value Ref Range    Opiates-Urine NEG NEG-NEG   PHENCYCLIDINES-URINE RANDOM    Collection Time: 06/09/19  3:46 PM   Result Value Ref Range    Phencyclidine (PCP) NEG NEG-NEG   POC TROPONIN    Collection Time: 06/09/19  3:48 PM   Result Value Ref Range    Troponin-I-POC 0.00 0.00 - 0.05 NG/ML       Marlinda Mike, DO

## 2019-06-09 NOTE — H&P (View-Only)
Name:  Charles Vaughan                                             MRN:  [id number]   Admission Date:  06/09/2019                     Assessment/Plan:      Sinus tachycardia  Dyspnea and chest pain  - initial admission labs unremarkable, check TSH  - EKG: sinus tachycardia with a HR 137  - Echo 02/13/19: LVEF 65-70%.  - CXR: No radiographic evidence of acute cardiopulmonary disease.  - pt says he did run out of his anxiety meds  - in ED received clonazepam 1 mg x 1 and Lorazepam 0.5 x 2  - denies SI/HI  - psych consulted    CAD  - s/p PCI 06/13/18  - continue on DAT, statin  - monitor on telemetry  - stress test ordered    AKI   - creatinine on admission is 1.4, it was 1.1.4 on 05/15/19  - trial of IVF  - check UA    HLD  - on statin    GERD  - continue PPI    Cocaine use  - pt says last time about 1 week ago    Former smoker  - continue PTA inhalers    Unsteady gait  - for 4 days, denies focal weakness  - check CPK  - PT/OT ordered    Plan of care was discussed in detail with the patient   Medication side effects explained  Imaging findings explained, rec-ed outpatient follow up with her pcp to address those findings  They verbalized understanding and an agreement with the above plan  ______________________________________________________________________________    Primary Care Physician: No Pcp, Na     Chief Complaint:     63 yo M with PMH as bellow who presents to ED for chest pain and panic attack. On admission patient says he has been feeling ill with cough and lightheadness for the last 4 days due to his allergy to the mold in his house. He denies fever/chills. He says he had last year a stent placed into his heart and he still takes Plavix. He states compliance most of the time with his PTA meds. His chest pain is intermittent, mid sternal, non radiating, last minutes, on/off, no ameliorating/aggravating factors noted. He does not smoke, drink alcohol occasionally, and uses cocaine with last cocaine use about 1 week ago. Pt also c/o occasionally hearing voices when he is coming down from cocaine use and he c/o excessive worry with sleep disruption and panic attacks with elevated heart rate, chest pain, hyperventilation, and lightheadedness. Patient says he just run out of Clonazepam. Other ROS reviewed and negative.    History of Present Illness: Charles Vaughan is a 63 y.o. male with PMH of     Medical History:   Diagnosis Date   ? CAD (coronary artery disease)    ? Hyperlipidemia      Surgical History:   Procedure Laterality Date   ? ANGIOGRAPHY CORONARY ARTERY WITH LEFT HEART CATHETERIZATION N/A 06/13/2018    Performed by Laney Pastor, MD at [facility] CATH LAB   ? PERCUTANEOUS CORONARY STENT PLACEMENT WITH ANGIOPLASTY N/A 06/13/2018    Performed by Laney Pastor, MD at [facility] CATH LAB  History reviewed. No pertinent family history.  Social History     Socioeconomic History   ? Marital status: Single     Spouse name: Not on file   ? Number of children: Not on file   ? Years of education: Not on file   ? Highest education level: Not on file   Occupational History   ? Not on file   Tobacco Use   ? Smoking status: Former Smoker     Last attempt to quit: 06/09/2014     Years since quitting: 5.0   ? Smokeless tobacco: Former Estate agent and Sexual Activity   ? Alcohol use: Yes     Comment: states he drinks often but is unsure how often   ? Drug use: Not Currently     Types: Cocaine     Comment: reports use 2 days ago    ? Sexual activity: Not on file   Other Topics Concern   ? Not on file   Social History Narrative   ? Not on file      Immunizations (includes history and patient reported):   There is no immunization history on file for this patient.        Allergies:  Patient has no known allergies.    Medications:  (Not in a hospital admission)    Review of Systems:  A comprehensive  12 point review of organ systems reviewed and was negative except for the ones mentioned in HOPI Physical Exam:  Vital Signs: Last Filed In 24 Hours Vital Signs: 24 Hour Range   BP: 132/93 (10/13 1400)  Temp: 37.7 ?C (99.8 ?F) (10/13 1227)  Pulse: 113 (10/13 1400)  Respirations: 25 PER MINUTE (10/13 1400)  SpO2: 94 % (10/13 1400)  SpO2 Pulse: 111 (10/13 1400) BP: (114-140)/(77-122)   Temp:  [37.7 ?C (99.8 ?F)]   Pulse:  [107-153]   Respirations:  [14 PER MINUTE-36 PER MINUTE]   SpO2:  [94 %-100 %]           General:  Alert, awake, oriented x 3 , cooperative, no distress  Head:  Normocephalic, without obvious abnormality, atraumatic  Eyes:  Conjunctivae/corneas clear   Nose: Nares normal. Mucosa normal.  No drainage or sinus tenderness  Throat: Lips, mucosa and tongue normal  Neck:    Supple, symmetrical, trachea midline  Lungs:  Clear to auscultation bilaterally  Heart:   Regular rate and rhythm, S1, S2 normal, no murmur  Abdomen:  Soft, non-tender.  Bowel sounds normal.   Extremities: Extremities normal, atraumatic, no cyanosis or edema  Skin: Skin color, texture, turgor normal.    Neurologic: Non focal grossly    Lab/Radiology/Other Diagnostic Tests:  24-hour labs:    Results for orders placed or performed during the hospital encounter of 06/09/19 (from the past 24 hour(s))   POC TROPONIN    Collection Time: 06/09/19 12:00 PM   Result Value Ref Range    Troponin-I-POC 0.03 0.00 - 0.05 NG/ML   CBC AND DIFF    Collection Time: 06/09/19 12:04 PM   Result Value Ref Range    White Blood Cells 9.9 4.5 - 11.0 K/UL    RBC 5.85 (H) 4.4 - 5.5 M/UL    Hemoglobin 16.9 (H) 13.5 - 16.5 GM/DL    Hematocrit 16.1 40 - 50 %    MCV 84.9 80 - 100 FL    MCH 28.9 26 - 34 PG    MCHC 34.0 32.0 - 36.0 G/DL  RDW 16.2 (H) 11 - 15 %    Platelet Count 464 (H) 150 - 400 K/UL    MPV 8.1 7 - 11 FL    Neutrophils 67 41 - 77 %    Lymphocytes 22 (L) 24 - 44 %    Monocytes 8 4 - 12 %    Eosinophils 2 0 - 5 %    Basophils 1 0 - 2 %    Absolute Neutrophil Count 6.60 1.8 - 7.0 K/UL    Absolute Lymph Count 2.21 1.0 - 4.8 K/UL Absolute Monocyte Count 0.78 0 - 0.80 K/UL    Absolute Eosinophil Count 0.16 0 - 0.45 K/UL    Absolute Basophil Count 0.12 0 - 0.20 K/UL    MDW (Monocyte Distribution Width) 17.9 <20.7   COMPREHENSIVE METABOLIC PANEL    Collection Time: 06/09/19 12:04 PM   Result Value Ref Range    Sodium 138 137 - 147 MMOL/L    Potassium 4.5 3.5 - 5.1 MMOL/L    Chloride 103 98 - 110 MMOL/L    Glucose 75 70 - 100 MG/DL    Blood Urea Nitrogen 18 7 - 25 MG/DL    Creatinine 1.61 (H) 0.4 - 1.24 MG/DL    Calcium 09.6 8.5 - 04.5 MG/DL    Total Protein 8.5 (H) 6.0 - 8.0 G/DL    Total Bilirubin 0.4 0.3 - 1.2 MG/DL    Albumin 4.7 3.5 - 5.0 G/DL    Alk Phosphatase 409 (H) 25 - 110 U/L    AST (SGOT) 28 7 - 40 U/L    CO2 24 21 - 30 MMOL/L    ALT (SGPT) 31 7 - 56 U/L    Anion Gap 11 3 - 12    eGFR Non African American 51 (L) >60 mL/min    eGFR African American >60 >60 mL/min   PROTIME INR (PT)    Collection Time: 06/09/19 12:04 PM   Result Value Ref Range    INR 1.0 0.8 - 1.2   PTT (APTT)    Collection Time: 06/09/19 12:04 PM   Result Value Ref Range    APTT 29.5 24.0 - 36.5 SEC   D-DIMER    Collection Time: 06/09/19 12:04 PM   Result Value Ref Range    D-Dimer 363 <500 ng/mL FEU   POC CREATININE, RAD    Collection Time: 06/09/19 12:09 PM   Result Value Ref Range    Creatinine, POC 1.4 (H) 0.4 - 1.24 MG/DL   BNP POC ER    Collection Time: 06/09/19 12:13 PM   Result Value Ref Range    BNP POC <15.0 0 - 100 PG/ML     Glucose: 75 (06/09/19 1204)  Pertinent radiology reviewed.    Chest Single View    Result Date: 06/09/2019  No radiographic evidence of acute cardiopulmonary disease.  Finalized by Arlana Hove, M.D. on 06/09/2019 1:43 PM. Dictated by Arlana Hove, M.D. on 06/09/2019 1:41 PM.

## 2019-06-09 NOTE — ED Notes
Pt given urinal and verbalized understanding that a urine sample was needed.

## 2019-06-10 ENCOUNTER — Encounter: Admit: 2019-06-10 | Discharge: 2019-06-10 | Payer: 59

## 2019-06-10 ENCOUNTER — Observation Stay: Admit: 2019-06-10 | Discharge: 2019-06-10 | Payer: 59

## 2019-06-10 DIAGNOSIS — I2510 Atherosclerotic heart disease of native coronary artery without angina pectoris: Secondary | ICD-10-CM

## 2019-06-10 DIAGNOSIS — E785 Hyperlipidemia, unspecified: Secondary | ICD-10-CM

## 2019-06-10 LAB — URINALYSIS DIPSTICK REFLEX TO CULTURE
Lab: NEGATIVE
Lab: NEGATIVE
Lab: NEGATIVE
Lab: NEGATIVE
Lab: NEGATIVE
Lab: NEGATIVE
Lab: NEGATIVE
Lab: NEGATIVE

## 2019-06-10 LAB — TROPONIN-I: Lab: 0 ng/mL (ref 0.0–0.05)

## 2019-06-10 LAB — COVID-19 (SARS-COV-2) PCR

## 2019-06-10 LAB — BASIC METABOLIC PANEL
Lab: 138 MMOL/L — ABNORMAL HIGH (ref 137–147)
Lab: 21 MMOL/L — ABNORMAL HIGH (ref 21–30)

## 2019-06-10 LAB — 25-OH VITAMIN D (D2 + D3): Lab: 17 ng/mL — ABNORMAL LOW (ref 30–80)

## 2019-06-10 LAB — URINALYSIS MICROSCOPIC REFLEX TO CULTURE

## 2019-06-10 LAB — HEMOGLOBIN A1C: Lab: 5.8 % — ABNORMAL LOW (ref >60)

## 2019-06-10 LAB — CBC AND DIFF: Lab: 6.4 10*3/uL — ABNORMAL LOW (ref >60)

## 2019-06-10 MED ORDER — NITROGLYCERIN 0.4 MG SL SUBL
.4 mg | SUBLINGUAL | 0 refills | Status: DC | PRN
Start: 2019-06-10 — End: 2019-06-20

## 2019-06-10 MED ORDER — LORAZEPAM 1 MG PO TAB
1 mg | ORAL | 0 refills | Status: DC
Start: 2019-06-10 — End: 2019-06-12

## 2019-06-10 MED ORDER — ALBUTEROL SULFATE 90 MCG/ACTUATION IN HFAA
2 | RESPIRATORY_TRACT | 0 refills | Status: DC | PRN
Start: 2019-06-10 — End: 2019-06-16
  Administered 2019-06-10: 23:00:00 2 via RESPIRATORY_TRACT

## 2019-06-10 MED ORDER — ALBUTEROL SULFATE 2.5 MG/0.5 ML IN NEBU
2.5 mg | RESPIRATORY_TRACT | 0 refills | Status: DC | PRN
Start: 2019-06-10 — End: 2019-06-10

## 2019-06-10 MED ORDER — HYDROXYZINE HCL 25 MG PO TAB
25 mg | ORAL | 0 refills | Status: DC | PRN
Start: 2019-06-10 — End: 2019-06-15
  Administered 2019-06-10 – 2019-06-15 (×15): 25 mg via ORAL

## 2019-06-10 MED ORDER — LORAZEPAM 1 MG PO TAB
1 mg | Freq: Once | ORAL | 0 refills | Status: CP
Start: 2019-06-10 — End: ?
  Administered 2019-06-10: 14:00:00 1 mg via ORAL

## 2019-06-10 MED ORDER — ALBUTEROL SULFATE 2.5 MG /3 ML (0.083 %) IN NEBU
2.5 mg | RESPIRATORY_TRACT | 0 refills | Status: DC | PRN
Start: 2019-06-10 — End: 2019-06-16

## 2019-06-10 MED ORDER — ALBUTEROL SULFATE 90 MCG/ACTUATION IN HFAA
2 | RESPIRATORY_TRACT | 0 refills | Status: DC | PRN
Start: 2019-06-10 — End: 2019-06-10

## 2019-06-10 MED ORDER — AMINOPHYLLINE 500 MG/20 ML IV SOLN
50 mg | INTRAVENOUS | 0 refills | Status: AC | PRN
Start: 2019-06-10 — End: ?

## 2019-06-10 MED ORDER — REGADENOSON 0.4 MG/5 ML IV SYRG
.4 mg | Freq: Once | INTRAVENOUS | 0 refills | Status: CP
Start: 2019-06-10 — End: ?
  Administered 2019-06-10: 15:00:00 0.4 mg via INTRAVENOUS

## 2019-06-10 MED ORDER — LORAZEPAM 1 MG PO TAB
1-2 mg | ORAL | 0 refills | Status: DC | PRN
Start: 2019-06-10 — End: 2019-06-14
  Administered 2019-06-11 – 2019-06-12 (×3): 1 mg via ORAL
  Administered 2019-06-12: 08:00:00 2 mg via ORAL
  Administered 2019-06-12: 16:00:00 1 mg via ORAL
  Administered 2019-06-13 (×5): 2 mg via ORAL
  Administered 2019-06-14 (×2): 1 mg via ORAL

## 2019-06-10 MED ORDER — LORAZEPAM 2 MG PO TAB
2 mg | ORAL | 0 refills | Status: CP
Start: 2019-06-10 — End: ?
  Administered 2019-06-10 – 2019-06-12 (×12): 2 mg via ORAL

## 2019-06-10 MED ORDER — LORAZEPAM 1 MG PO TAB
1 mg | Freq: Two times a day (BID) | ORAL | 0 refills | Status: DC
Start: 2019-06-10 — End: 2019-06-12

## 2019-06-10 MED ORDER — SODIUM CHLORIDE 0.9 % IV SOLP
250 mL | INTRAVENOUS | 0 refills | Status: AC | PRN
Start: 2019-06-10 — End: ?

## 2019-06-10 MED ORDER — EUCALYPTUS-MENTHOL MM LOZG
1 | Freq: Once | ORAL | 0 refills | Status: AC | PRN
Start: 2019-06-10 — End: ?

## 2019-06-10 MED ORDER — LORAZEPAM 1 MG PO TAB
1 mg | ORAL | 0 refills | Status: DC | PRN
Start: 2019-06-10 — End: 2019-06-10

## 2019-06-10 MED ORDER — LORATADINE 10 MG PO TAB
20 mg | Freq: Every day | ORAL | 0 refills | Status: DC
Start: 2019-06-10 — End: 2019-06-20
  Administered 2019-06-10 – 2019-06-20 (×11): 20 mg via ORAL

## 2019-06-10 NOTE — ED Notes
Report given to Anthony, RN.

## 2019-06-10 NOTE — Progress Notes
Patient arrived to room [phone] via wheelchair accompanied by transport staff. Patient transferred to the bed with assistance. Bedside safety checks completed. Initial patient assessment completed. Refer to flowsheet for details.    Admission skin assessment completed with: Virgel Gess    Pressure injury present on arrival?: No    1. Head/Face/Neck: No  2. Trunk/Back: No  3. Upper Extremities: No  4. Lower Extremities: No;  Abrasion on top bilateral feet  5. Pelvic/Coccyx: No  6. Assessed for device associated injury? Yes  7. Malnutrition Screening Tool (Nursing Nutrition Assessment) Completed? No    See Doc Flowsheet for additional wound details.     INTERVENTIONS:

## 2019-06-11 LAB — CBC: Lab: 5.7 K/UL — ABNORMAL LOW (ref 4.5–11.0)

## 2019-06-11 LAB — BASIC METABOLIC PANEL: Lab: 140 MMOL/L — ABNORMAL LOW (ref 137–147)

## 2019-06-12 LAB — CBC
Lab: 4.3 M/UL — ABNORMAL LOW (ref 4.4–5.5)
Lab: 6.3 10*3/uL — ABNORMAL LOW (ref 4.5–11.0)

## 2019-06-12 LAB — BASIC METABOLIC PANEL
Lab: 109 MMOL/L — ABNORMAL LOW (ref 98–110)
Lab: 139 MMOL/L — ABNORMAL LOW (ref 137–147)
Lab: >60 mL/min — ABNORMAL HIGH (ref >60)

## 2019-06-12 MED ORDER — GABAPENTIN 300 MG PO CAP
300 mg | Freq: Three times a day (TID) | ORAL | 0 refills | Status: CP
Start: 2019-06-12 — End: ?
  Administered 2019-06-18 – 2019-06-20 (×6): 300 mg via ORAL

## 2019-06-12 MED ORDER — GABAPENTIN 300 MG PO CAP
600 mg | Freq: Three times a day (TID) | ORAL | 0 refills | Status: CP
Start: 2019-06-12 — End: ?
  Administered 2019-06-16 – 2019-06-18 (×6): 600 mg via ORAL

## 2019-06-12 MED ORDER — GABAPENTIN 400 MG PO CAP
800 mg | Freq: Three times a day (TID) | ORAL | 0 refills | Status: CP
Start: 2019-06-12 — End: ?
  Administered 2019-06-13: 02:00:00 800 mg via ORAL

## 2019-06-12 MED ORDER — GABAPENTIN 400 MG PO CAP
800 mg | Freq: Three times a day (TID) | ORAL | 0 refills | Status: CP
Start: 2019-06-12 — End: ?
  Administered 2019-06-13 – 2019-06-16 (×9): 800 mg via ORAL

## 2019-06-12 MED ORDER — GABAPENTIN 400 MG PO CAP
1200 mg | Freq: Once | ORAL | 0 refills | Status: CP
Start: 2019-06-12 — End: ?
  Administered 2019-06-12: 20:00:00 1200 mg via ORAL

## 2019-06-13 LAB — BASIC METABOLIC PANEL: Lab: 140 MMOL/L — ABNORMAL LOW (ref 137–147)

## 2019-06-13 LAB — CBC: Lab: 6.2 10*3/uL — ABNORMAL HIGH (ref 4.5–11.0)

## 2019-06-13 MED ORDER — LACTATED RINGERS IV SOLP
Freq: Once | INTRAVENOUS | 0 refills | Status: CP
Start: 2019-06-13 — End: ?
  Administered 2019-06-13: 16:00:00 1000.000 mL via INTRAVENOUS

## 2019-06-14 LAB — CBC
Lab: 5 M/UL — ABNORMAL HIGH (ref >60)
Lab: 7 10*3/uL — ABNORMAL LOW (ref >60)

## 2019-06-14 LAB — BASIC METABOLIC PANEL: Lab: 140 MMOL/L (ref >60)

## 2019-06-14 MED ORDER — DIPHENHYDRAMINE HCL 50 MG/ML IJ SOLN
12.5 mg | Freq: Once | INTRAVENOUS | 0 refills | Status: CP
Start: 2019-06-14 — End: ?
  Administered 2019-06-14: 16:00:00 12.5 mg via INTRAVENOUS

## 2019-06-14 MED ORDER — MAGNESIUM SULFATE IN D5W 1 GRAM/100 ML IV PGBK
1 g | Freq: Once | INTRAVENOUS | 0 refills | Status: CP
Start: 2019-06-14 — End: ?
  Administered 2019-06-14: 16:00:00 1 g via INTRAVENOUS

## 2019-06-14 MED ORDER — PROCHLORPERAZINE EDISYLATE 5 MG/ML IJ SOLN
10 mg | Freq: Once | INTRAVENOUS | 0 refills | Status: CP
Start: 2019-06-14 — End: ?
  Administered 2019-06-14: 16:00:00 10 mg via INTRAVENOUS

## 2019-06-15 LAB — CBC: Lab: 7.9 K/UL — ABNORMAL LOW (ref 4.5–11.0)

## 2019-06-15 LAB — BASIC METABOLIC PANEL: Lab: 138 MMOL/L — ABNORMAL LOW (ref >60)

## 2019-06-15 MED ORDER — FLU VACC QS2020-21 6MOS UP(PF) 60 MCG (15 MCG X 4)/0.5 ML IM SYRG
.5 mL | Freq: Once | INTRAMUSCULAR | 0 refills | Status: CP
Start: 2019-06-15 — End: ?
  Administered 2019-06-15: 13:00:00 0.5 mL via INTRAMUSCULAR

## 2019-06-15 MED ORDER — ERGOCALCIFEROL (VITAMIN D2) 1,250 MCG (50,000 UNIT) PO CAP
50000 [IU] | ORAL | 0 refills | Status: DC
Start: 2019-06-15 — End: 2019-06-20
  Administered 2019-06-16: 15:00:00 50000 [IU] via ORAL

## 2019-06-15 MED ORDER — BUSPIRONE 10 MG PO TAB
10 mg | Freq: Three times a day (TID) | ORAL | 0 refills | Status: DC
Start: 2019-06-15 — End: 2019-06-20
  Administered 2019-06-16 – 2019-06-20 (×14): 10 mg via ORAL

## 2019-06-15 MED ORDER — HYDROXYZINE HCL 25 MG PO TAB
25 mg | ORAL | 0 refills | Status: DC | PRN
Start: 2019-06-15 — End: 2019-06-20
  Administered 2019-06-16 – 2019-06-20 (×11): 25 mg via ORAL

## 2019-06-15 MED ORDER — HYDROXYZINE HCL 25 MG PO TAB
25 mg | Freq: Once | ORAL | 0 refills | Status: CP
Start: 2019-06-15 — End: ?
  Administered 2019-06-15: 07:00:00 25 mg via ORAL

## 2019-06-15 MED ORDER — BUSPIRONE 10 MG PO TAB
5 mg | Freq: Once | ORAL | 0 refills | Status: CP
Start: 2019-06-15 — End: ?
  Administered 2019-06-15: 22:00:00 5 mg via ORAL

## 2019-06-15 MED ORDER — SODIUM CHLORIDE 0.9 % IV SOLP
INTRAVENOUS | 0 refills | Status: DC
Start: 2019-06-15 — End: 2019-06-17
  Administered 2019-06-15 – 2019-06-17 (×6): 1000.000 mL via INTRAVENOUS

## 2019-06-16 LAB — IRON + BINDING CAPACITY + %SAT+ FERRITIN: Lab: 51 ug/dL — ABNORMAL LOW (ref >60)

## 2019-06-16 MED ORDER — FERROUS SULFATE 325 MG (65 MG IRON) PO TAB
325 mg | Freq: Every day | ORAL | 0 refills | Status: DC
Start: 2019-06-16 — End: 2019-06-20
  Administered 2019-06-16 – 2019-06-20 (×5): 325 mg via ORAL

## 2019-06-16 MED ORDER — PRAMIPEXOLE 0.25 MG PO TAB
.125 mg | Freq: Every evening | ORAL | 0 refills | Status: DC
Start: 2019-06-16 — End: 2019-06-20
  Administered 2019-06-17 – 2019-06-20 (×4): 0.125 mg via ORAL

## 2019-06-16 MED ORDER — ALBUTEROL SULFATE 90 MCG/ACTUATION IN HFAA
2 | Freq: Two times a day (BID) | RESPIRATORY_TRACT | 0 refills | Status: DC | PRN
Start: 2019-06-16 — End: 2019-06-20

## 2019-06-17 LAB — BASIC METABOLIC PANEL: Lab: 139 MMOL/L — ABNORMAL LOW (ref 137–147)

## 2019-06-17 LAB — CBC AND DIFF: Lab: 5.9 K/UL — ABNORMAL LOW (ref >60)

## 2019-06-18 LAB — CBC AND DIFF: Lab: 6 K/UL — ABNORMAL LOW (ref 4.5–11.0)

## 2019-06-18 LAB — BASIC METABOLIC PANEL: Lab: 141 MMOL/L — ABNORMAL LOW (ref 137–147)

## 2019-06-19 LAB — CBC AND DIFF
Lab: 5 M/UL — ABNORMAL LOW (ref 4.4–5.5)
Lab: 6.1 K/UL — ABNORMAL LOW (ref 4.5–11.0)

## 2019-06-19 LAB — BASIC METABOLIC PANEL: Lab: 137 MMOL/L — ABNORMAL LOW (ref 137–147)

## 2019-06-19 MED ORDER — GABAPENTIN 300 MG PO CAP
300 mg | ORAL_CAPSULE | Freq: Three times a day (TID) | ORAL | 0 refills | Status: CN
Start: 2019-06-19 — End: ?

## 2019-06-20 ENCOUNTER — Encounter: Admit: 2019-06-20 | Discharge: 2019-06-20 | Payer: 59

## 2019-06-20 LAB — BASIC METABOLIC PANEL: Lab: 137 MMOL/L — ABNORMAL LOW (ref 137–147)

## 2019-06-20 LAB — CBC AND DIFF: Lab: 6.3 K/UL — ABNORMAL LOW (ref 4.5–11.0)

## 2019-06-20 MED ORDER — THIAMINE MONONITRATE (VIT B1) 100 MG PO TAB
100 mg | ORAL_TABLET | Freq: Every day | ORAL | 1 refills | Status: CN
Start: 2019-06-20 — End: ?

## 2019-06-20 MED ORDER — ERGOCALCIFEROL (VITAMIN D2) 1,250 MCG (50,000 UNIT) PO CAP
1 | ORAL_CAPSULE | ORAL | 0 refills | 56.00000 days | Status: AC
Start: 2019-06-20 — End: ?
  Filled 2019-06-20: qty 12, 28d supply, fill #1

## 2019-06-20 MED ORDER — BUSPIRONE 10 MG PO TAB
10 mg | ORAL_TABLET | Freq: Three times a day (TID) | ORAL | 1 refills | Status: AC
Start: 2019-06-20 — End: ?
  Filled 2019-06-20: qty 90, 30d supply, fill #1

## 2019-06-20 MED ORDER — PRAMIPEXOLE 0.125 MG PO TAB
.125 mg | ORAL_TABLET | Freq: Every evening | ORAL | 1 refills | Status: CN
Start: 2019-06-20 — End: ?

## 2019-06-20 MED ORDER — LORATADINE 10 MG PO TAB
20 mg | ORAL_TABLET | Freq: Every day | ORAL | 1 refills | Status: CN
Start: 2019-06-20 — End: ?

## 2019-06-20 MED ORDER — FLUTICASONE PROPIONATE 50 MCG/ACTUATION NA SPSN
2 | Freq: Two times a day (BID) | NASAL | 1 refills | 60.00000 days | Status: AC
Start: 2019-06-20 — End: ?
  Filled 2019-06-20: qty 16, 30d supply, fill #1

## 2019-06-20 MED ORDER — HYDROXYZINE HCL 25 MG PO TAB
25 mg | ORAL_TABLET | ORAL | 1 refills | 30.00000 days | Status: AC | PRN
Start: 2019-06-20 — End: ?
  Filled 2019-06-20: qty 30, 8d supply, fill #1

## 2019-06-20 MED ORDER — FERROUS SULFATE 325 MG (65 MG IRON) PO TAB
325 mg | ORAL_TABLET | Freq: Every day | ORAL | 0 refills | Status: CN
Start: 2019-06-20 — End: ?

## 2019-07-14 ENCOUNTER — Encounter: Admit: 2019-07-14 | Discharge: 2019-07-14 | Payer: 59

## 2019-07-29 ENCOUNTER — Encounter: Admit: 2019-07-29 | Discharge: 2019-07-29 | Payer: 59

## 2019-10-26 ENCOUNTER — Encounter: Admit: 2019-10-26 | Discharge: 2019-10-26 | Payer: 59
